# Patient Record
Sex: Female | Born: 1970
Health system: Southern US, Community
[De-identification: ages and names within clinical notes are randomized; demographics above are authoritative.]

## PROBLEM LIST (undated history)

## (undated) DIAGNOSIS — I10 Essential (primary) hypertension: Secondary | ICD-10-CM

## (undated) DIAGNOSIS — D649 Anemia, unspecified: Secondary | ICD-10-CM

## (undated) DIAGNOSIS — F419 Anxiety disorder, unspecified: Secondary | ICD-10-CM

## (undated) DIAGNOSIS — E785 Hyperlipidemia, unspecified: Secondary | ICD-10-CM

## (undated) DIAGNOSIS — R0600 Dyspnea, unspecified: Secondary | ICD-10-CM

## (undated) HISTORY — DX: Hyperlipidemia, unspecified: E78.5

## (undated) HISTORY — DX: Anxiety disorder, unspecified: F41.9

## (undated) HISTORY — DX: Anemia, unspecified: D64.9

## (undated) HISTORY — PX: APPENDECTOMY: SHX54

---

## 1999-09-26 ENCOUNTER — Emergency Department (HOSPITAL_COMMUNITY): Admission: EM | Admit: 1999-09-26 | Discharge: 1999-09-26 | Payer: Self-pay | Admitting: Emergency Medicine

## 2007-01-30 ENCOUNTER — Emergency Department (HOSPITAL_COMMUNITY): Admission: EM | Admit: 2007-01-30 | Discharge: 2007-01-30 | Payer: Self-pay | Admitting: Emergency Medicine

## 2010-05-05 ENCOUNTER — Emergency Department (HOSPITAL_BASED_OUTPATIENT_CLINIC_OR_DEPARTMENT_OTHER)
Admission: EM | Admit: 2010-05-05 | Discharge: 2010-05-05 | Disposition: A | Discharge status: Home / Self Care | Payer: Self-pay | Attending: Emergency Medicine | Admitting: Emergency Medicine

## 2010-05-05 DIAGNOSIS — R109 Unspecified abdominal pain: Secondary | ICD-10-CM | POA: Insufficient documentation

## 2010-05-05 DIAGNOSIS — F3289 Other specified depressive episodes: Secondary | ICD-10-CM | POA: Insufficient documentation

## 2010-05-05 DIAGNOSIS — R51 Headache: Secondary | ICD-10-CM | POA: Insufficient documentation

## 2010-05-05 DIAGNOSIS — F329 Major depressive disorder, single episode, unspecified: Secondary | ICD-10-CM | POA: Insufficient documentation

## 2010-05-05 DIAGNOSIS — I10 Essential (primary) hypertension: Secondary | ICD-10-CM | POA: Insufficient documentation

## 2010-05-05 LAB — URINALYSIS, ROUTINE W REFLEX MICROSCOPIC
Ketones, ur: NEGATIVE mg/dL
Leukocytes, UA: NEGATIVE
Nitrite: NEGATIVE
Protein, ur: 30 mg/dL — AB
Urobilinogen, UA: 0.2 mg/dL (ref 0.0–1.0)
pH: 7 (ref 5.0–8.0)

## 2010-05-05 LAB — DIFFERENTIAL
Basophils Relative: 0 % (ref 0–1)
Eosinophils Absolute: 0.1 10*3/uL (ref 0.0–0.7)
Monocytes Absolute: 0.4 10*3/uL (ref 0.1–1.0)
Monocytes Relative: 5 % (ref 3–12)

## 2010-05-05 LAB — CBC
Hemoglobin: 12.1 g/dL (ref 12.0–15.0)
MCH: 28.1 pg (ref 26.0–34.0)
MCHC: 35 g/dL (ref 30.0–36.0)
Platelets: 390 10*3/uL (ref 150–400)

## 2010-05-05 LAB — COMPREHENSIVE METABOLIC PANEL
ALT: 16 U/L (ref 0–35)
BUN: 5 mg/dL — ABNORMAL LOW (ref 6–23)
CO2: 28 mEq/L (ref 19–32)
CO2: 28 mEq/L (ref 19–32)
Calcium: 9.4 mg/dL (ref 8.4–10.5)
Chloride: 108 mEq/L (ref 96–112)
Creatinine, Ser: 1 mg/dL (ref 0.4–1.2)
GFR calc non Af Amer: >60 mL/min (ref >60)
Glucose, Bld: 117 mg/dL — ABNORMAL HIGH (ref 70–99)
Potassium: 3.4 mEq/L — ABNORMAL LOW (ref 3.5–5.1)
Sodium: 146 mEq/L — ABNORMAL HIGH (ref 135–145)
Total Bilirubin: 0.7 mg/dL (ref 0.3–1.2)

## 2010-05-05 LAB — POCT TOXICOLOGY PANEL

## 2010-05-05 LAB — ETHANOL: Alcohol, Ethyl (B): <10 mg/dL (ref 0–10)

## 2010-05-05 LAB — PROTIME-INR: Prothrombin Time: 13.4 seconds (ref 11.6–15.2)

## 2011-11-12 ENCOUNTER — Emergency Department (HOSPITAL_BASED_OUTPATIENT_CLINIC_OR_DEPARTMENT_OTHER)
Admission: EM | Admit: 2011-11-12 | Discharge: 2011-11-12 | Disposition: A | Discharge status: Home / Self Care | Payer: Self-pay | Attending: Emergency Medicine | Admitting: Emergency Medicine

## 2011-11-12 ENCOUNTER — Emergency Department (HOSPITAL_BASED_OUTPATIENT_CLINIC_OR_DEPARTMENT_OTHER): Payer: Self-pay

## 2011-11-12 ENCOUNTER — Encounter (HOSPITAL_BASED_OUTPATIENT_CLINIC_OR_DEPARTMENT_OTHER): Payer: Self-pay | Admitting: *Deleted

## 2011-11-12 DIAGNOSIS — D649 Anemia, unspecified: Secondary | ICD-10-CM | POA: Insufficient documentation

## 2011-11-12 DIAGNOSIS — R079 Chest pain, unspecified: Secondary | ICD-10-CM | POA: Insufficient documentation

## 2011-11-12 DIAGNOSIS — I10 Essential (primary) hypertension: Secondary | ICD-10-CM | POA: Insufficient documentation

## 2011-11-12 HISTORY — DX: Essential (primary) hypertension: I10

## 2011-11-12 LAB — COMPREHENSIVE METABOLIC PANEL
ALT: 9 U/L (ref 0–35)
BUN: 9 mg/dL (ref 6–23)
CO2: 25 mEq/L (ref 19–32)
Calcium: 7.9 mg/dL — ABNORMAL LOW (ref 8.4–10.5)
GFR calc Af Amer: 70 mL/min — ABNORMAL LOW (ref >90)
GFR calc non Af Amer: 61 mL/min — ABNORMAL LOW (ref >90)
Glucose, Bld: 82 mg/dL (ref 70–99)
Sodium: 140 mEq/L (ref 135–145)

## 2011-11-12 LAB — URINALYSIS, ROUTINE W REFLEX MICROSCOPIC
Bilirubin Urine: NEGATIVE
Ketones, ur: NEGATIVE mg/dL
Nitrite: NEGATIVE
Specific Gravity, Urine: 1.014 (ref 1.005–1.030)
Urobilinogen, UA: 1 mg/dL (ref 0.0–1.0)

## 2011-11-12 LAB — CBC WITH DIFFERENTIAL/PLATELET
Basophils Relative: 0 % (ref 0–1)
Eosinophils Absolute: 0.2 10*3/uL (ref 0.0–0.7)
MCH: 30.5 pg (ref 26.0–34.0)
MCHC: 35.6 g/dL (ref 30.0–36.0)
Neutrophils Relative %: 58 % (ref 43–77)
Platelets: 238 10*3/uL (ref 150–400)
RBC: 3.05 MIL/uL — ABNORMAL LOW (ref 3.87–5.11)

## 2011-11-12 LAB — URINE MICROSCOPIC-ADD ON

## 2011-11-12 LAB — PREGNANCY, URINE: Preg Test, Ur: NEGATIVE

## 2011-11-12 MED ORDER — FERROUS SULFATE 325 (65 FE) MG PO TABS: 325.0000 mg | ORAL_TABLET | Freq: Every day | ORAL | Status: DC

## 2011-11-12 NOTE — Discharge Instructions (Signed)
Iron Deficiency Anemia There are many types of anemia. Iron deficiency anemia is the most common. Iron deficiency anemia is a decrease in the number of red blood cells caused by too little iron. Without enough iron, your body does not produce enough hemoglobin. Hemoglobin is a substance in red blood cells that carries oxygen to the body's tissues. Iron deficiency anemia may leave you tired and short of breath. CAUSES   Lack of iron in the diet.   This may be seen in infants and children, because there is little iron in milk.   This may be seen in adults who do not eat enough iron-rich foods.   This may be seen in pregnant or breastfeeding women who do not take iron supplements. There is a much higher need for iron intake at these times.   Poor absorption of iron, as seen with intestinal disorders.   Intestinal bleeding.   Heavy periods.  SYMPTOMS  Mild anemia may not be noticeable. Symptoms may include:  Fatigue.   Headache.   Pale skin.   Weakness.   Shortness of breath.   Dizziness.   Cold hands and feet.   Fast or irregular heartbeat.  DIAGNOSIS  Diagnosis requires a thorough evaluation and physical exam by your caregiver.  Blood tests are generally used to confirm iron deficiency anemia.   Additional tests may be done to find the underlying cause of your anemia. These may include:   Testing for blood in the stool (fecal occult blood test).   A procedure to see inside the colon and rectum (colonoscopy).   A procedure to see inside the esophagus and stomach (endoscopy).  TREATMENT   Correcting the cause of the iron deficiency is the first step.   Medicines, such as oral contraceptives, can make heavy menstrual flows lighter.   Antibiotics and other medicines can be used to treat peptic ulcers.   Surgery may be needed to remove a bleeding polyp, tumor, or fibroid.   Often, iron supplements (ferrous sulfate) are taken.   For the best iron absorption, take  these supplements with an empty stomach.   You may need to take the supplements with food if you cannot tolerate them on an empty stomach. Vitamin C improves the absorption of iron. Your caregiver may recommend taking your iron tablets with a glass of orange juice or vitamin C supplement.   Milk and antacids should not be taken at the same time as iron supplements. They may interfere with the absorption of iron.   Iron supplements can cause constipation. A stool softener is often recommended.   Pregnant and breastfeeding women will need to take extra iron, because their normal diet usually will not provide the required amount.   Patients who cannot tolerate iron by mouth can take it through a vein (intravenously) or by an injection into the muscle.  HOME CARE INSTRUCTIONS   Ask your dietitian for help with diet questions.   Take iron and vitamins as directed by your caregiver.   Eat a diet rich in iron. Eat liver, lean beef, whole-grain bread, eggs, dried fruit, and dark green leafy vegetables.  SEEK IMMEDIATE MEDICAL CARE IF:   You have a fainting episode. Do not drive yourself. Call your local emergency services (911 in [place] if no other help is available.   You have chest pain, nausea, or vomiting.   You develop severe or increased shortness of breath with activities.   You develop weakness or increased thirst.   You have   a rapid heartbeat.   You develop unexplained sweating or become lightheaded when getting up from a chair or bed.  MAKE SURE YOU:   Understand these instructions.   Will watch your condition.   Will get help right away if you are not doing well or get worse.  Document Released: 02/28/2000 Document Revised: 02/19/2011 Document Reviewed: 07/09/2009 [facility] Patient Information 2012 [place], [place].Anemia, Frequently Asked Questions WHAT ARE THE SYMPTOMS OF ANEMIA?  Headache.   Difficulty thinking.   Fatigue.   Shortness of breath.   Weakness.    Rapid heartbeat.  AT WHAT POINT ARE PEOPLE CONSIDERED ANEMIC?  This varies with gender and age.   Both hemoglobin (Hgb) and hematocrit values are used to define anemia. These lab values are obtained from a complete blood count (CBC) test. This is performed at a caregiver's office.   The normal range of hemoglobin values for adult men is 14.0 g/dL to 57.8 g/dL. For nonpregnant women, values are 12.3 g/dL to 46.9 g/dL.   The World Health Organization defines anemia as less than 12 g/dL for nonpregnant women and less than 13 g/dL for men.   For adult males, the average normal hematocrit is 46%, and the range is 40% to 52%.   For adult females, the average normal hematocrit is 41%, and the range is 35% to 47%.   Values that fall below the lower limits can be a sign of anemia and should have further checking (evaluation).  GROUPS OF PEOPLE WHO ARE AT RISK FOR DEVELOPING ANEMIA INCLUDE:   Infants who are breastfed or taking a formula that is not fortified with iron.   Children going through a rapid growth spurt. The iron available can not keep up with the needs for a red cell mass which must grow with the child.   Women in childbearing years. They need iron because of blood loss during menstruation.   Pregnant women. The growing fetus creates a high demand for iron.   People with ongoing gastrointestinal blood loss are at risk of developing iron deficiency.   Individuals with leukemia or cancer who must receive chemotherapy or radiation to treat their disease. The drugs or radiation used to treat these diseases often decreases the bone marrow's ability to make cells of all classes. This includes red blood cells, white blood cells, and platelets.   Individuals with chronic inflammatory conditions such as rheumatoid arthritis or chronic infections.   The elderly.  ARE SOME TYPES OF ANEMIA INHERITED?   Yes, some types of anemia are due to inherited or genetic defects.   Sickle cell  anemia. This occurs most often in people of African, African American, and Mediterranean descent.   Thalassemia (or Cooley's anemia). This type is found in people of Mediterranean and Southeast Asian descent. These types of anemia are common.   [place]. This is rare.  CAN CERTAIN MEDICATIONS CAUSE A PERSON TO BECOME ANEMIC?  Yes. For example, drugs to fight cancer (chemotherapeutic agents) often cause anemia. These drugs can slow the bone marrow's ability to make red blood cells. If there are not enough red blood cells, the body does not get enough oxygen. WHAT HEMATOCRIT LEVEL IS REQUIRED TO DONATE BLOOD?  The lower limit of an acceptable hematocrit for blood donors is 38%. If you have a low hematocrit value, you should schedule an appointment with your caregiver. ARE BLOOD TRANSFUSIONS COMMONLY USED TO CORRECT ANEMIA, AND ARE THEY DANGEROUS?  They are used to treat anemia as a last resort. Your  caregiver will find the cause of the anemia and correct it if possible. Most blood transfusions are given because of excessive bleeding at the time of surgery, with trauma, or because of bone marrow suppression in patients with cancer or leukemia on chemotherapy. Blood transfusions are safer than ever before. We also know that blood transfusions affect the immune system and may increase certain risks. There is also a concern for human error. In 1/16,000 transfusions, a patient receives a transfusion of blood that is not matched with his or her blood type.  WHAT IS IRON DEFICIENCY ANEMIA AND CAN I CORRECT IT BY CHANGING MY DIET?  Iron is an essential part of hemoglobin. Without enough hemoglobin, anemia develops and the body does not get the right amount of oxygen. Iron deficiency anemia develops after the body has had a low level of iron for a long time. This is either caused by blood loss, not taking in or absorbing enough iron, or increased demands for iron (like pregnancy or rapid growth).  Foods from animal  origin such as beef, chicken, and pork, are good sources of iron. Be sure to have one of these foods at each meal. Vitamin C helps your body absorb iron. Foods rich in Vitamin C include citrus, bell pepper, strawberries, spinach and cantaloupe. In some cases, iron supplements may be needed in order to correct the iron deficiency. In the case of poor absorption, extra iron may have to be given directly into the vein through a needle (intravenously). I HAVE BEEN DIAGNOSED WITH IRON DEFICIENCY ANEMIA AND MY CAREGIVER PRESCRIBED IRON SUPPLEMENTS. HOW LONG WILL IT TAKE FOR MY BLOOD TO BECOME NORMAL?  It depends on the degree of anemia at the beginning of treatment. Most people with mild to moderate iron deficiency, anemia will correct the anemia over a period of 2 to 3 months. But after the anemia is corrected, the iron stored by the body is still low. Caregivers often suggest an additional 6 months of oral iron therapy once the anemia has been reversed. This will help prevent the iron deficiency anemia from quickly happening again. Non-anemic adult males should take iron supplements only under the direction of a doctor, too much iron can cause liver damage.  MY HEMOGLOBIN IS 9 G/DL AND I AM SCHEDULED FOR SURGERY. SHOULD I POSTPONE THE SURGERY?  If you have Hgb of 9, you should discuss this with your caregiver right away. Many patients with similar hemoglobin levels have had surgery without problems. If minimal blood loss is expected for a minor procedure, no treatment may be necessary.  If a greater blood loss is expected for more extensive procedures, you should ask your caregiver about being treated with erythropoietin and iron. This is to accelerate the recovery of your hemoglobin to a normal level before surgery. An anemic patient who undergoes high-blood-loss surgery has a greater risk of surgical complications and need for a blood transfusion, which also carries some risk.  I HAVE BEEN TOLD THAT HEAVY  MENSTRUAL PERIODS CAUSE ANEMIA. IS THERE ANYTHING I CAN DO TO PREVENT THE ANEMIA?  Anemia that results from heavy periods is usually due to iron deficiency. You can try to meet the increased demands for iron caused by the heavy monthly blood loss by increasing the intake of iron-rich foods. Iron supplements may be required. Discuss your concerns with your caregiver. WHAT CAUSES ANEMIA DURING PREGNANCY?  Pregnancy places major demands on the body. The mother must meet the needs of both her body and her  growing baby. The body needs enough iron and folate to make the right amount of red blood cells. To prevent anemia while pregnant, the mother should stay in close contact with her caregiver.  Be sure to eat a diet that has foods rich in iron and folate like liver and dark green leafy vegetables. Folate plays an important role in the normal development of a baby's spinal cord. Folate can help prevent serious disorders like spina bifida. If your diet does not provide adequate nutrients, you may want to talk with your caregiver about nutritional supplements.  WHAT IS THE RELATIONSHIP BETWEEN FIBROID TUMORS AND ANEMIA IN WOMEN?  The relationship is usually caused by the increased menstrual blood loss caused by fibroids. Good iron intake may be required to prevent iron deficiency anemia from developing.  Document Released: 10/09/2003 Document Revised: 02/19/2011 Document Reviewed: 03/25/2010 [facility] Patient Information 2012 [place], [place].

## 2011-11-12 NOTE — ED Notes (Signed)
Pain in her chest 30 mins. Fingers are numb. Yesterday she felt like things were crawling on her and chest pain same as today.

## 2011-11-12 NOTE — ED Provider Notes (Addendum)
History     CSN: [id number]  Arrival date & time 11/12/11  1928   First MD Initiated Contact with Patient 11/12/11 1946      Chief Complaint  Patient presents with  . Chest Pain    (Consider location/radiation/quality/duration/timing/severity/associated sxs/prior treatment) Patient is a 41 y.o. female presenting with chest pain. The history is provided by the patient.  Chest Pain The chest pain began yesterday (2 episodes 1 yesterday and 1 today). Duration of episode(s) is 2 minutes. Chest pain occurs intermittently. The chest pain is unchanged. The pain is associated with stress and exertion. At its most intense, the pain is at 3/10. The pain is currently at 0/10. The severity of the pain is mild. Quality: aching and tightness that lasted 2 min and resolved. The pain does not radiate. Chest pain is worsened by exertion. Primary symptoms include shortness of breath. Pertinent negatives for primary symptoms include no abdominal pain, no nausea and no vomiting.  Associated symptoms include lower extremity edema. She tried nothing for the symptoms.  Her past medical history is significant for hypertension.  Pertinent negatives for past medical history include no diabetes and no MI.     Past Medical History  Diagnosis Date  . Hypertension     Past Surgical History  Procedure Date  . Appendectomy     No family history on file.  History  Substance Use Topics  . Smoking status: Never Smoker   . Smokeless tobacco: Not on file  . Alcohol Use: No    OB History    Grav Para Term Preterm Abortions TAB SAB Ect Mult Living                  Review of Systems  Respiratory: Positive for shortness of breath.   Cardiovascular: Positive for chest pain.  Gastrointestinal: Negative for nausea, vomiting and abdominal pain.  Neurological:       Neck pain and tingling in the fingers for months  All other systems reviewed and are negative.    Allergies  Review of patient's allergies  indicates no known allergies.  Home Medications   Current Outpatient Rx  Name Route Sig Dispense Refill  . FLUOXETINE HCL 40 MG PO CAPS Oral Take 40 mg by mouth daily.    Marland Kitchen LISINOPRIL-HYDROCHLOROTHIAZIDE 20-12.5 MG PO TABS Oral Take 1 tablet by mouth daily.      BP 141/84  Pulse 77  Temp 98.7 F (37.1 C) (Oral)  Resp 20  SpO2 100%  Physical Exam  Nursing note and vitals reviewed. Constitutional: She is oriented to person, place, and time. She appears well-developed and well-nourished. No distress.  HENT:  Head: Normocephalic and atraumatic.  Mouth/Throat: Oropharynx is clear and moist.  Eyes: Conjunctivae and EOM are normal. Pupils are equal, round, and reactive to light.  Neck: Normal range of motion. Neck supple.  Cardiovascular: Normal rate, regular rhythm and intact distal pulses.   No murmur heard. Pulmonary/Chest: Effort normal and breath sounds normal. No respiratory distress. She has no wheezes. She has no rales. She exhibits no tenderness.  Abdominal: Soft. She exhibits no distension. There is no tenderness. There is no rebound and no guarding.  Musculoskeletal: Normal range of motion. She exhibits edema. She exhibits no tenderness.       1+ lower ext edema  Neurological: She is alert and oriented to person, place, and time. She has normal strength. No sensory deficit.  Skin: Skin is warm and dry. No rash noted. No erythema.  Psychiatric: She has a normal mood and affect. Her behavior is normal.    ED Course  Procedures (including critical care time)  Labs Reviewed  COMPREHENSIVE METABOLIC PANEL - Abnormal; Notable for the following:    Potassium 3.4 (*)     Calcium 7.9 (*)     Total Protein 5.8 (*)     Albumin 2.9 (*)     Total Bilirubin 0.1 (*)     GFR calc non Af Amer 61 (*)     GFR calc Af Amer 70 (*)     All other components within normal limits  URINALYSIS, ROUTINE W REFLEX MICROSCOPIC - Abnormal; Notable for the following:    APPearance CLOUDY (*)       Hgb urine dipstick LARGE (*)     All other components within normal limits  CBC WITH DIFFERENTIAL - Abnormal; Notable for the following:    RBC 3.05 (*)     Hemoglobin 9.3 (*)     HCT 26.1 (*)     All other components within normal limits  URINE MICROSCOPIC-ADD ON - Abnormal; Notable for the following:    Squamous Epithelial / LPF FEW (*)     All other components within normal limits  PREGNANCY, URINE  TROPONIN I   Dg Chest 2 View  11/12/2011  *RADIOLOGY REPORT*  Clinical Data: Chest pain.  Finger numbness.  CHEST - 2 VIEW  Comparison: None.  Findings: Lungs are clear.  Heart size upper normal.  No pneumothorax or pleural fluid.  There is abnormal increased density of the left first rib which appears mildly expanded.  IMPRESSION:  1.  No acute disease. 2.  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   Original Report Authenticated By: Bernadene Bell. Maricela Curet, M.D.      Date: 11/12/2011  Rate: 74  Rhythm: normal sinus rhythm  QRS Axis: normal  Intervals: normal  ST/T Wave abnormalities: normal  Conduction Disutrbances: none  Narrative Interpretation: unremarkable      1. Anemia       MDM   Patient with atypical chest pain that started yesterday. She states she's had 2 episodes that lasted no more than 2 minutes. She also stated that she felt short of breath with walking today and noticed swelling in her legs. Patient states she's been under more stress recently and that has caused pain similar to this in the past. Patient does take blood pressure medication and has been taking it as prescribed. She states she's currently on her period and does admit to heavy flow. Only risk factor for cardiac disease and hypertension. She is a TIMI 1 for more than one episode in 24 hours.  Patient denies any current pain. EKG is within normal limits. Urine pregnancy test is negative. CBC shows a new anemia with a  hemoglobin of 9.3 from 12.1 over a year ago. She has no prior history of anemia. This could be the cause of her symptoms.  9:48 PM Pt given iron.  Low suspicion for cardiac dx.  Will f/u with PCP.     Gwyneth Sprout, MD 11/12/11 4098  Gwyneth Sprout, MD 11/12/11 2149

## 2011-11-12 NOTE — ED Notes (Signed)
Pt reports chest pain mid sternum lasting only a few minutes, pressure like pain, intermittent sob, 2+ pedal edema,

## 2013-06-14 ENCOUNTER — Ambulatory Visit: Payer: Self-pay | Admitting: Family Medicine

## 2013-06-14 VITALS — BP 174/102 | HR 80 | Temp 97.9°F | Resp 18 | Ht 66.75 in | Wt 224.0 lb

## 2013-06-14 DIAGNOSIS — I10 Essential (primary) hypertension: Secondary | ICD-10-CM

## 2013-06-14 DIAGNOSIS — F411 Generalized anxiety disorder: Secondary | ICD-10-CM

## 2013-06-14 DIAGNOSIS — F329 Major depressive disorder, single episode, unspecified: Secondary | ICD-10-CM

## 2013-06-14 DIAGNOSIS — F3289 Other specified depressive episodes: Secondary | ICD-10-CM

## 2013-06-14 MED ORDER — FLUOXETINE HCL 40 MG PO CAPS: 40.0000 mg | ORAL_CAPSULE | Freq: Every day | ORAL | Status: DC

## 2013-06-14 MED ORDER — LISINOPRIL-HYDROCHLOROTHIAZIDE 20-12.5 MG PO TABS: 1.0000 | ORAL_TABLET | Freq: Every day | ORAL | Status: DC

## 2013-06-14 NOTE — Patient Instructions (Signed)

## 2013-06-14 NOTE — Progress Notes (Signed)
This chart was scribed for [name], MD by [name], Scribe. This patient was seen in room 2 and the patient's care was started at 7:40 PM.  Subjective:    Patient ID: Brittany Morgan, female    DOB: Dec 02, 1970, 43 y.o.   MRN: [id number]  HPI  HPI Comments: SHAKIYA Morgan is a 43 y.o. Female with a history of HTN (diagnosed 2 years ago) who presents to [facility] complaining of uncontrolled HTN. She states that she has been prescribed Lisinopril in the past but that she has been out of this medication for "a while", and she would like to start taking it again. Pt's husband states that pt's blood pressure was well-controlled when she was taking Lisinopril ("117-120's/75-80's). She reports that she has been having pressure in the back of her neck and intermittent headaches recently. She also states that when her blood pressure is elevated, her vision becomes "less sharp". She states that she noticed some leg swelling yesterday, but that she was on her feet for long periods yesterday. She reports that it is not unusual for her to have leg swelling, but that she has not really noticed much of this since before being diagnosed with HTN. She denies any history of stroke, MI or other cardiovascular history, or any other pertinent past medical history. She states that both of her parents are still living. She reports that her mother had an MI at age 80, which required stent placement. She states that her father has a history of HTN and that he had a CVA at about 24. She also states that her father has a questionable history of colon CA.  She has a secondary complaint of work-related stress recently. She works in a Forensic psychologist at [employer] and describes that she is required to do an unrealistic amount of work. She further reports that she feels like she is expendable. She states that she has a history of anxiety and has had issues with this in the past. She states that she has taken Prozac  in the past with some relief of this, but that she is out of this medication at well. She denies any SI or HI. She denies any SOB.  She states that she is married and she has 3 children (age 38, 29 and 72). She reports that she still has a menstrual cycle, and that it has never been regular, and that she occasionally goes 2-3 months without having a period. She reports that her husband had a vasectomy.   Past Medical History  Diagnosis Date  . Hypertension   . Anxiety      Past Surgical History  Procedure Laterality Date  . Appendectomy     No Known Allergies  Current outpatient prescriptions: FLUoxetine (PROZAC) 40 MG capsule, Take 1 capsule (40 mg total) by mouth daily., Disp: 90 capsule, Rfl: 1;  lisinopril-hydrochlorothiazide (PRINZIDE,ZESTORETIC) 20-12.5 MG per tablet, Take 1 tablet by mouth daily., Disp: 90 tablet, Rfl: 1;  ferrous sulfate 325 (65 FE) MG tablet, Take 1 tablet (325 mg total) by mouth daily., Disp: 30 tablet, Rfl: 0   Review of Systems  Constitutional: Negative for fever, chills, diaphoresis and fatigue.  Eyes: Positive for visual disturbance.  Respiratory: Negative for cough and shortness of breath.   Cardiovascular: Positive for leg swelling. Negative for chest pain and palpitations.  Gastrointestinal: Negative for nausea, vomiting, abdominal pain, diarrhea and constipation.  Endocrine: Negative for cold intolerance, heat intolerance, polydipsia, polyphagia and  polyuria.  Musculoskeletal: Positive for neck pain and neck stiffness.  Neurological: Positive for headaches. Negative for dizziness, tremors, seizures, syncope, facial asymmetry, speech difficulty, weakness, light-headedness and numbness.  Psychiatric/Behavioral: Negative for suicidal ideas, sleep disturbance, self-injury and dysphoric mood. The patient is nervous/anxious.       Objective:   Physical Exam  Constitutional: She is oriented to person, place, and time. She appears well-developed and  well-nourished. No distress.  HENT:  Head: Normocephalic and atraumatic.  Right Ear: External ear normal.  Left Ear: External ear normal.  Nose: Nose normal.  Mouth/Throat: Oropharynx is clear and moist.  Eyes: Conjunctivae and EOM are normal. Pupils are equal, round, and reactive to light.  Neck: Normal range of motion and full passive range of motion without pain. Neck supple. No spinous process tenderness and no muscular tenderness present. Carotid bruit is not present. Normal range of motion present. No thyromegaly present.  Cardiovascular: Normal rate, regular rhythm, normal heart sounds and intact distal pulses.  Exam reveals no gallop and no friction rub.   No murmur heard. Pulmonary/Chest: Effort normal and breath sounds normal. She has no wheezes. She has no rales.  Abdominal: Soft. Bowel sounds are normal. She exhibits no distension and no mass. There is no tenderness. There is no rebound and no guarding.  Musculoskeletal: Normal range of motion.  Lymphadenopathy:    She has no cervical adenopathy.  Neurological: She is alert and oriented to person, place, and time. No cranial nerve deficit.  Skin: Skin is warm and dry. No rash noted. She is not diaphoretic. No erythema. No pallor.  Psychiatric: She has a normal mood and affect. Her behavior is normal.  Nursing note and vitals reviewed.  Filed Vitals:   06/14/13 1810  BP: 174/102  Pulse: 80  Temp: 97.9 F (36.6 C)  TempSrc: Oral  Resp: 18  Height: 5' 6.75" (1.695 m)  Weight: 224 lb (101.606 kg)  SpO2: 98%        Assessment & Plan:   1. Essential hypertension, benign   2. Generalized anxiety disorder     1.  HTN: uncontrolled due to non-compliance with medications; obtain labs; rx provided; recommend RTC two months for BP check on medication. 2.  Generalized anxiety disorder: New/recurrent; rx for Prozac 40mg  1/2 tablet daily for two months and then increase to one tablet daily as needed.  RTC two months for  reevaluation.   Meds ordered this encounter  Medications  . FLUoxetine (PROZAC) 40 MG capsule    Sig: Take 1 capsule (40 mg total) by mouth daily.    Dispense:  90 capsule    Refill:  1  . lisinopril-hydrochlorothiazide (PRINZIDE,ZESTORETIC) 20-12.5 MG per tablet    Sig: Take 1 tablet by mouth daily.    Dispense:  90 tablet    Refill:  1   I personally performed the services described in this documentation, which was scribed in my presence.  The recorded information has been reviewed and is accurate.  Reginia Forts, M.D.  [facility] [address] [place], [place]  [address] [phone] phone [phone] fax

## 2013-06-15 LAB — COMPREHENSIVE METABOLIC PANEL
ALBUMIN: 4.4 g/dL (ref 3.5–5.2)
ALK PHOS: 51 U/L (ref 39–117)
ALT: 12 U/L (ref 0–35)
AST: 14 U/L (ref 0–37)
BUN: 19 mg/dL (ref 6–23)
CALCIUM: 9.7 mg/dL (ref 8.4–10.5)
CHLORIDE: 103 meq/L (ref 96–112)
CO2: 26 mEq/L (ref 19–32)
Creat: 1.19 mg/dL — ABNORMAL HIGH (ref 0.50–1.10)
Glucose, Bld: 91 mg/dL (ref 70–99)
POTASSIUM: 4.7 meq/L (ref 3.5–5.3)
SODIUM: 138 meq/L (ref 135–145)
TOTAL PROTEIN: 7.6 g/dL (ref 6.0–8.3)
Total Bilirubin: 0.3 mg/dL (ref 0.2–1.2)

## 2013-06-21 ENCOUNTER — Encounter: Payer: Self-pay | Admitting: Family Medicine

## 2014-02-11 ENCOUNTER — Other Ambulatory Visit: Payer: Self-pay | Admitting: Family Medicine

## 2014-04-20 ENCOUNTER — Other Ambulatory Visit: Payer: Self-pay

## 2014-04-20 MED ORDER — FLUOXETINE HCL 40 MG PO CAPS: ORAL_CAPSULE | ORAL | Status: DC

## 2014-04-20 MED ORDER — LISINOPRIL-HYDROCHLOROTHIAZIDE 20-12.5 MG PO TABS: ORAL_TABLET | ORAL | Status: DC

## 2014-05-18 ENCOUNTER — Other Ambulatory Visit: Payer: Self-pay | Admitting: Family Medicine

## 2014-05-30 ENCOUNTER — Telehealth: Payer: Self-pay

## 2014-05-30 NOTE — Telephone Encounter (Signed)
Got faxes from pharm to RF prozac and lisinopril. We have been putting messages on last few RFs to RTC. I faxed pharm back w/this info and also LMOM for pt to CB w/f/up plan or just RTC right away.

## 2014-05-31 ENCOUNTER — Telehealth: Payer: Self-pay

## 2014-05-31 NOTE — Telephone Encounter (Signed)
Patient understands that she must have an office visit in order to get more medication refills and is wondering is she cold atleast could get a week supply of blood pressure until she can be seen. Patient can't make it for an office visit until a week from now and she is completely out. Please call patient if there are questions!

## 2014-06-01 MED ORDER — LISINOPRIL-HYDROCHLOROTHIAZIDE 20-12.5 MG PO TABS: ORAL_TABLET | ORAL | Status: DC

## 2014-06-01 MED ORDER — FLUOXETINE HCL 40 MG PO CAPS: ORAL_CAPSULE | ORAL | Status: DC

## 2014-06-01 NOTE — Telephone Encounter (Signed)
See notes under 3/17 phone message

## 2014-06-13 ENCOUNTER — Ambulatory Visit (INDEPENDENT_AMBULATORY_CARE_PROVIDER_SITE_OTHER): Payer: No Typology Code available for payment source | Admitting: Emergency Medicine

## 2014-06-13 VITALS — BP 161/116 | HR 76 | Temp 98.0°F | Resp 16 | Ht 67.0 in | Wt 235.0 lb

## 2014-06-13 DIAGNOSIS — I10 Essential (primary) hypertension: Secondary | ICD-10-CM | POA: Diagnosis not present

## 2014-06-13 DIAGNOSIS — F411 Generalized anxiety disorder: Secondary | ICD-10-CM | POA: Diagnosis not present

## 2014-06-13 MED ORDER — LISINOPRIL-HYDROCHLOROTHIAZIDE 20-12.5 MG PO TABS: ORAL_TABLET | ORAL | Status: DC

## 2014-06-13 MED ORDER — FLUOXETINE HCL 40 MG PO CAPS: ORAL_CAPSULE | ORAL | Status: DC

## 2014-06-13 NOTE — Addendum Note (Signed)
Addended byGrant Fontana R on: 06/13/2014 06:58 PM   Modules accepted: Orders

## 2014-06-13 NOTE — Progress Notes (Signed)
[facility] [address] [place] [address] [phone]  Date:  06/13/2014   Name:  Brittany Morgan   DOB:  [birth year]   MRN:  [id number]  PCP:  No PCP Per Patient    Chief Complaint: Medication Refill   History of Present Illness:  Brittany Morgan is a 44 y.o. very pleasant female patient who presents with the following:  Hypertension.  Sporadically takes her medications due finances Takes BP at home and runs in 114-120 range  Just got insurance.  Tolerating medications well with no adverse effect or end organ issues No improvement with over the counter medications or other home remedies. Denies other complaint or health concern today.   There are no active problems to display for this patient.   Past Medical History  Diagnosis Date  . Hypertension   . Anxiety     Past Surgical History  Procedure Laterality Date  . Appendectomy      History  Substance Use Topics  . Smoking status: Never Smoker   . Smokeless tobacco: Never Used  . Alcohol Use: No    Family History  Problem Relation Age of Onset  . Hyperlipidemia Mother   . Heart disease Mother     AMI; s/p stenting  . Stroke Father 42  . Hypertension Father     No Known Allergies  Medication list has been reviewed and updated.  Current Outpatient Prescriptions on File Prior to Visit  Medication Sig Dispense Refill  . FLUoxetine (PROZAC) 40 MG capsule TAKE ONE CAPSULE BY MOUTH ONCE DAILY. NO MORE REFILLS WITHOUT OFFICE VISIT - 2ND NOTICE 7 capsule 0  . lisinopril-hydrochlorothiazide (PRINZIDE,ZESTORETIC) 20-12.5 MG per tablet TAKE ONE TABLET BY MOUTH ONCE DAILY. NO MORE REFILLS WITHOUT OFFICE VISIT - 2ND NOTICE 7 tablet 0  . ferrous sulfate 325 (65 FE) MG tablet Take 1 tablet (325 mg total) by mouth daily. (Patient not taking: Reported on 06/13/2014) 30 tablet 0   No current facility-administered medications on file prior to visit.    Review of Systems:  As per HPI, otherwise  negative.    Physical Examination: Filed Vitals:   06/13/14 1820  BP: 161/116  Pulse: 76  Temp: 98 F (36.7 C)  Resp: 16   Filed Vitals:   06/13/14 1820  Height: 5\' 7"  (1.702 m)  Weight: 235 lb (106.595 kg)   Body mass index is 36.8 kg/(m^2). Ideal Body Weight: Weight in (lb) to have BMI = 25: 159.3  GEN: WDWN, NAD, Non-toxic, A & O x 3 HEENT: Atraumatic, Normocephalic. Neck supple. No masses, No LAD. Ears and Nose: No external deformity. CV: RRR, No M/G/R. No JVD. No thrill. No extra heart sounds. PULM: CTA B, no wheezes, crackles, rhonchi. No retractions. No resp. distress. No accessory muscle use. ABD: S, NT, ND, +BS. No rebound. No HSM. EXTR: No c/c/e NEURO Normal gait.  PSYCH: Normally interactive. Conversant. Not depressed or anxious appearing.  Calm demeanor.    Assessment and Plan: Hypertension Depression Labs refill Reinforced need for follow up  Signed,  Ellison Carwin, MD

## 2014-06-13 NOTE — Patient Instructions (Signed)

## 2016-02-05 ENCOUNTER — Ambulatory Visit (HOSPITAL_COMMUNITY)
Admission: EM | Admit: 2016-02-05 | Discharge: 2016-02-05 | Disposition: A | Discharge status: Home / Self Care | Payer: Self-pay | Attending: Emergency Medicine | Admitting: Emergency Medicine

## 2016-02-05 ENCOUNTER — Encounter (HOSPITAL_COMMUNITY): Payer: Self-pay | Admitting: Emergency Medicine

## 2016-02-05 DIAGNOSIS — Z760 Encounter for issue of repeat prescription: Secondary | ICD-10-CM

## 2016-02-05 DIAGNOSIS — F419 Anxiety disorder, unspecified: Secondary | ICD-10-CM

## 2016-02-05 DIAGNOSIS — I10 Essential (primary) hypertension: Secondary | ICD-10-CM

## 2016-02-05 LAB — POCT I-STAT, CHEM 8
BUN: 14 mg/dL (ref 6–20)
Calcium, Ion: 1.2 mmol/L (ref 1.15–1.40)
Chloride: 101 mmol/L (ref 101–111)
Creatinine, Ser: 1.1 mg/dL — ABNORMAL HIGH (ref 0.44–1.00)
GLUCOSE: 92 mg/dL (ref 65–99)
HCT: 42 % (ref 36.0–46.0)
Hemoglobin: 14.3 g/dL (ref 12.0–15.0)
Potassium: 3.8 mmol/L (ref 3.5–5.1)
Sodium: 139 mmol/L (ref 135–145)
TCO2: 28 mmol/L (ref 0–100)

## 2016-02-05 MED ORDER — FLUOXETINE HCL 40 MG PO CAPS: ORAL_CAPSULE | ORAL | 2 refills | Status: DC

## 2016-02-05 MED ORDER — LISINOPRIL-HYDROCHLOROTHIAZIDE 20-12.5 MG PO TABS: ORAL_TABLET | ORAL | 2 refills | Status: DC

## 2016-02-05 NOTE — ED Provider Notes (Signed)
[place]    CSN: [id number] Arrival date & time: 02/05/16  1338     History   Chief Complaint Chief Complaint  Patient presents with  . Medication Refill    HPI CLEATIS HOFFERT is a 45 y.o. female.   HPI She is a 45 year old woman here for medication refill. She has been out of her medication for about 1-1/2 months. She reports a history of anxiety and depression as well as hypertension. She has been on Prozac which she states controls her anxiety well. She denies any suicidal ideation or homicidal thoughts.  She states her blood pressure was well controlled on her lisinopril-HCTZ combo. She denies any chest pain, shortness of breath, vision changes, leg swelling. She does report a slight headache, but attributes this to how she slept last night.  The only reason she does not have her medications is that she has been unable to afford a doctor. She and her husband are hoping to get insurance here shortly.  Past Medical History:  Diagnosis Date  . Anxiety   . Hypertension     There are no active problems to display for this patient.   Past Surgical History:  Procedure Laterality Date  . APPENDECTOMY      OB History    No data available       Home Medications    Prior to Admission medications   Medication Sig Start Date End Date Taking? Authorizing Provider  FLUoxetine (PROZAC) 40 MG capsule TAKE ONE CAPSULE BY MOUTH ONCE DAILY. 02/05/16   Melony Overly, MD  lisinopril-hydrochlorothiazide (PRINZIDE,ZESTORETIC) 20-12.5 MG tablet TAKE ONE TABLET BY MOUTH ONCE DAILY. 02/05/16   Melony Overly, MD    Family History Family History  Problem Relation Age of Onset  . Hyperlipidemia Mother   . Heart disease Mother     AMI; s/p stenting  . Stroke Father 27  . Hypertension Father     Social History Social History  Substance Use Topics  . Smoking status: Never Smoker  . Smokeless tobacco: Never Used  . Alcohol use No     Allergies   Patient has no  known allergies.   Review of Systems Review of Systems As in history of present illness  Physical Exam Triage Vital Signs ED Triage Vitals  Enc Vitals Group     BP 02/05/16 1358 (!) 159/101     Pulse Rate 02/05/16 1358 71     Resp 02/05/16 1358 18     Temp 02/05/16 1358 98.1 F (36.7 C)     Temp Source 02/05/16 1358 Oral     SpO2 02/05/16 1358 99 %     Weight --      Height --      Head Circumference --      Peak Flow --      Pain Score 02/05/16 1403 0     Pain Loc --      Pain Edu? --      Excl. in [place]? --    No data found.   Updated Vital Signs BP (!) 159/101 (BP Location: Left Arm)   Pulse 71   Temp 98.1 F (36.7 C) (Oral)   Resp 18   LMP 12/29/2015 (Approximate)   SpO2 99%   Visual Acuity Right Eye Distance:   Left Eye Distance:   Bilateral Distance:    Right Eye Near:   Left Eye Near:    Bilateral Near:     Physical Exam  Constitutional:  She is oriented to person, place, and time. She appears well-developed and well-nourished. No distress.  Cardiovascular: Normal rate, regular rhythm and normal heart sounds.   No murmur heard. Pulmonary/Chest: Effort normal and breath sounds normal. No respiratory distress. She has no wheezes. She has no rales.  Neurological: She is alert and oriented to person, place, and time.     UC Treatments / Results  Labs (all labs ordered are listed, but only abnormal results are displayed) Labs Reviewed  POCT I-STAT, CHEM 8 - Abnormal; Notable for the following:       Result Value   Creatinine, Ser 1.10 (*)    All other components within normal limits    EKG  EKG Interpretation None       Radiology No results found.  Procedures Procedures (including critical care time)  Medications Ordered in UC Medications - No data to display   Initial Impression / Assessment and Plan / UC Course  I have reviewed the triage vital signs and the nursing notes.  Pertinent labs & imaging results that were available  during my care of the patient were reviewed by me and considered in my medical decision making (see chart for details).  Clinical Course     Creatinine mildly elevated at 1.1, but stable from prior blood work. Three-month supply of Prozac and lisinopril-HCTZ provided. She will continue workking on obtaining insurance and finding a PCP.  Final Clinical Impressions(s) / UC Diagnoses   Final diagnoses:  Medication refill  Anxiety  Essential hypertension    New Prescriptions Current Discharge Medication List       Melony Overly, MD 02/05/16 1448

## 2016-02-05 NOTE — Discharge Instructions (Signed)
Your blood work looks good. I provided a three-month supply a Prozac and hypertension medicine. Follow-up as needed.

## 2016-02-05 NOTE — ED Triage Notes (Signed)
The patient presented to the [facility] with a complaint of needing her medications refilled. The patient requested a refill on her HTN meds and Prozac. The patient is followed by [facility].

## 2016-06-15 DIAGNOSIS — I10 Essential (primary) hypertension: Secondary | ICD-10-CM | POA: Insufficient documentation

## 2016-06-15 DIAGNOSIS — F32A Depression, unspecified: Secondary | ICD-10-CM | POA: Insufficient documentation

## 2016-06-24 DIAGNOSIS — E785 Hyperlipidemia, unspecified: Secondary | ICD-10-CM | POA: Insufficient documentation

## 2017-05-20 ENCOUNTER — Other Ambulatory Visit: Payer: Self-pay

## 2017-05-20 ENCOUNTER — Ambulatory Visit (HOSPITAL_COMMUNITY)
Admission: EM | Admit: 2017-05-20 | Discharge: 2017-05-20 | Disposition: A | Discharge status: Home / Self Care | Payer: Self-pay | Attending: Family Medicine | Admitting: Family Medicine

## 2017-05-20 ENCOUNTER — Encounter (HOSPITAL_COMMUNITY): Payer: Self-pay | Admitting: Emergency Medicine

## 2017-05-20 DIAGNOSIS — I10 Essential (primary) hypertension: Secondary | ICD-10-CM

## 2017-05-20 DIAGNOSIS — Z760 Encounter for issue of repeat prescription: Secondary | ICD-10-CM

## 2017-05-20 DIAGNOSIS — F419 Anxiety disorder, unspecified: Secondary | ICD-10-CM

## 2017-05-20 MED ORDER — LISINOPRIL-HYDROCHLOROTHIAZIDE 20-12.5 MG PO TABS: ORAL_TABLET | ORAL | 2 refills | Status: DC

## 2017-05-20 MED ORDER — FLUOXETINE HCL 40 MG PO CAPS: ORAL_CAPSULE | ORAL | 2 refills | Status: DC

## 2017-05-20 NOTE — ED Triage Notes (Signed)
Pt states she is inbetween PCPs due to insurance, ran out of her anxiety and BP meds, has been out of her anxiety meds for several months, and last took her BP med 4 days ago.

## 2017-05-20 NOTE — ED Provider Notes (Signed)
[place]   [id number] 05/20/17 Arrival Time: 7169  ASSESSMENT & PLAN:  1. Medication refill   2. Essential hypertension   3. Anxiety     Meds ordered this encounter  Medications  . FLUoxetine (PROZAC) 40 MG capsule    Sig: TAKE ONE CAPSULE BY MOUTH ONCE DAILY.    Dispense:  30 capsule    Refill:  2  . lisinopril-hydrochlorothiazide (PRINZIDE,ZESTORETIC) 20-12.5 MG tablet    Sig: TAKE ONE TABLET BY MOUTH ONCE DAILY.    Dispense:  30 tablet    Refill:  2   May f/u here until she finds a PCP. Reviewed expectations re: course of current medical issues. Questions answered. Outlined signs and symptoms indicating need for more acute intervention. Patient verbalized understanding. After Visit Summary given.   SUBJECTIVE: History from: patient. Brittany Morgan is a 47 y.o. female who presents requesting medication refill. Looking for PCP. Plans to have insurance again within a few months. Out of BP and anxiety medications. No current adverse symptoms. Reports medical problems are controlled with these medications. Last took BP medication about 4 days ago.  Current medical problems include:  Past Medical History:  Diagnosis Date  . Anxiety   . Hypertension    Current Outpatient Medications:  .  FLUoxetine (PROZAC) 40 MG capsule, TAKE ONE CAPSULE BY MOUTH ONCE DAILY., Disp: 30 capsule, Rfl: 2 .  lisinopril-hydrochlorothiazide (PRINZIDE,ZESTORETIC) 20-12.5 MG tablet, TAKE ONE TABLET BY MOUTH ONCE DAILY., Disp: 30 tablet, Rfl: 2  ROS: As per HPI.   OBJECTIVE:  Vitals:   05/20/17 1704  BP: (!) 152/103  Pulse: 86  Resp: 18  Temp: 98.1 F (36.7 C)  SpO2: 100%    General appearance: alert; no distress Neck: supple  Lungs: clear to auscultation bilaterally Heart: regular rate and rhythm Abdomen: soft, non-tender Extremities: no edema; symmetrical with no gross deformities Skin: warm and dry Neurologic: normal gait; normal symmetric reflexes Psychological:  alert and cooperative; normal mood and affect  No Known Allergies  Past Medical History:  Diagnosis Date  . Anxiety   . Hypertension    Social History   Socioeconomic History  . Marital status: Married    Spouse name: Not on file  . Number of children: Not on file  . Years of education: Not on file  . Highest education level: Not on file  Social Needs  . Financial resource strain: Not on file  . Food insecurity - worry: Not on file  . Food insecurity - inability: Not on file  . Transportation needs - medical: Not on file  . Transportation needs - non-medical: Not on file  Occupational History  . Not on file  Tobacco Use  . Smoking status: Never Smoker  . Smokeless tobacco: Never Used  Substance and Sexual Activity  . Alcohol use: No    Alcohol/week: 0.0 oz  . Drug use: No  . Sexual activity: Yes    Birth control/protection: Surgical    Comment: Husband vasectomy  Other Topics Concern  . Not on file  Social History Narrative   Marital status: married      Children: 3 children (24, 86, 69); no grandchildren.      Employment:  Environmental consultant.      Tobacco:      Alcohol:      Exercise:         Family History  Problem Relation Age of Onset  . Hyperlipidemia Mother   . Heart disease Mother  AMI; s/p stenting  . Stroke Father 67  . Hypertension Father    Past Surgical History:  Procedure Laterality Date  . Felicita Gage, MD 05/24/17 1009

## 2019-10-27 ENCOUNTER — Encounter (HOSPITAL_COMMUNITY): Payer: Self-pay | Admitting: Emergency Medicine

## 2019-10-27 ENCOUNTER — Other Ambulatory Visit: Payer: Self-pay

## 2019-10-27 ENCOUNTER — Ambulatory Visit (HOSPITAL_COMMUNITY)
Admission: EM | Admit: 2019-10-27 | Discharge: 2019-10-27 | Disposition: A | Discharge status: Home / Self Care | Payer: Self-pay | Attending: Urgent Care | Admitting: Urgent Care

## 2019-10-27 DIAGNOSIS — F32A Depression, unspecified: Secondary | ICD-10-CM

## 2019-10-27 DIAGNOSIS — F329 Major depressive disorder, single episode, unspecified: Secondary | ICD-10-CM

## 2019-10-27 DIAGNOSIS — I10 Essential (primary) hypertension: Secondary | ICD-10-CM

## 2019-10-27 DIAGNOSIS — R519 Headache, unspecified: Secondary | ICD-10-CM

## 2019-10-27 MED ORDER — LISINOPRIL 10 MG PO TABS
10.0000 mg | ORAL_TABLET | Freq: Every day | ORAL | 0 refills | Status: DC
Start: 2019-10-27 — End: 2019-12-22

## 2019-10-27 MED ORDER — HYDROCHLOROTHIAZIDE 12.5 MG PO TABS: 12.5000 mg | ORAL_TABLET | Freq: Every day | ORAL | 0 refills | Status: DC

## 2019-10-27 MED ORDER — FLUOXETINE HCL 10 MG PO TABS
10.0000 mg | ORAL_TABLET | Freq: Every day | ORAL | 1 refills | Status: DC
Start: 2019-10-27 — End: 2020-02-21

## 2019-10-27 NOTE — ED Provider Notes (Signed)
[place]   MRN: [id number] DOB: Nov 18, 1970  Subjective:   Brittany Morgan is a 49 y.o. female presenting for help with her blood pressure, depression.  Patient states that she was previously on lisinopril hydrochlorothiazide but lost her PCP and did not follow-up.  She has been out of her medication.  Her blood pressures have been increasingly worsening 140s to 160s.  She has also had more persistent headaches.  Denies confusion, vision change, chest pain, shortness of breath.  She has also previously been on Prozac and came off of this for the same as above.  States that she has had worsening difficulty with her mood, feeling down less energy.  She has also had difficulty focusing.  Denies history of suicide attempt, desires for self-harm.  Denies any thoughts of death currently.  She would like to establish care with a new PCP but does not have insurance.  No current facility-administered medications for this encounter.  Current Outpatient Medications:  .  FLUoxetine (PROZAC) 40 MG capsule, TAKE ONE CAPSULE BY MOUTH ONCE DAILY. (Patient not taking: Reported on 10/27/2019), Disp: 30 capsule, Rfl: 2 .  lisinopril-hydrochlorothiazide (PRINZIDE,ZESTORETIC) 20-12.5 MG tablet, TAKE ONE TABLET BY MOUTH ONCE DAILY. (Patient not taking: Reported on 10/27/2019), Disp: 30 tablet, Rfl: 2   No Known Allergies  Past Medical History:  Diagnosis Date  . Anxiety   . Hypertension      Past Surgical History:  Procedure Laterality Date  . APPENDECTOMY      Family History  Problem Relation Age of Onset  . Hyperlipidemia Mother   . Heart disease Mother        AMI; s/p stenting  . Stroke Father 41  . Hypertension Father     Social History   Tobacco Use  . Smoking status: Never Smoker  . Smokeless tobacco: Never Used  Substance Use Topics  . Alcohol use: No    Alcohol/week: 0.0 standard drinks  . Drug use: No    ROS   Objective:   Vitals: BP (!) 157/117 (BP Location: Right  Arm)   Pulse 100   Temp 98.1 F (36.7 C) (Oral)   Resp 18   LMP  (LMP Unknown)   SpO2 100%   Physical Exam Constitutional:      General: She is not in acute distress.    Appearance: Normal appearance. She is well-developed. She is not ill-appearing, toxic-appearing or diaphoretic.  HENT:     Head: Normocephalic and atraumatic.     Nose: Nose normal.     Mouth/Throat:     Mouth: Mucous membranes are moist.  Eyes:     Extraocular Movements: Extraocular movements intact.     Pupils: Pupils are equal, round, and reactive to light.  Cardiovascular:     Rate and Rhythm: Normal rate and regular rhythm.     Pulses: Normal pulses.     Heart sounds: Normal heart sounds. No murmur heard.  No friction rub. No gallop.   Pulmonary:     Effort: Pulmonary effort is normal. No respiratory distress.     Breath sounds: Normal breath sounds. No stridor. No wheezing, rhonchi or rales.  Skin:    General: Skin is warm and dry.     Findings: No rash.  Neurological:     Mental Status: She is alert and oriented to person, place, and time.     Cranial Nerves: No cranial nerve deficit.     Motor: No weakness.     Coordination: Coordination normal.  Gait: Gait normal.     Deep Tendon Reflexes: Reflexes normal.     Comments: Negative Romberg and pronator drift.  Psychiatric:        Attention and Perception: Attention normal.        Mood and Affect: Mood is depressed. Mood is not anxious or elated. Affect is flat. Affect is not labile, blunt, angry, tearful or inappropriate.        Behavior: Behavior normal.        Thought Content: Thought content normal.        Cognition and Memory: Cognition normal.        Judgment: Judgment normal.      Assessment and Plan :   PDMP not reviewed this encounter.  1. Essential hypertension   2. Generalized headaches   3. Depression, unspecified depression type     Patient has no signs of ACS, stroke on exam.  Recommended restarting hydrochlorothiazide,  lisinopril and included dosing titration instructions.  We will also restart her on Prozac at 10 with titration instructions to 20 mg.  Emphasized need for follow-up, currently patient contracts for safety and has no thoughts about death or suicidal ideation.  Discussed black box warning for patient.  Establish care with new PCP through [facility] internal medicine.  Counseled patient on potential for adverse effects with medications prescribed/recommended today, ER and return-to-clinic precautions discussed, patient verbalized understanding.    Jaynee Eagles, [place] 10/27/19 1831

## 2019-10-27 NOTE — Discharge Instructions (Signed)
For your blood pressure medication start taking lisinopril and hydrochlorothiazide once daily for 1 week. Thereafter increase to 2 tablets once daily for each medication if your blood pressure readings remain greater than 163W systolic (top number).   For fluoxetine, start with 1 tablet once daily for 2 weeks. If you're doing ok with the medication then increase to 2 tablets once daily. I would highly recommend follow up if symptoms persist thereafter.    For diabetes or elevated blood sugar, please make sure you are avoiding starchy, carbohydrate foods like pasta, breads, pastry, rice, potatoes, desserts. These foods can elevated your blood sugar. Also, avoid sodas, sweet teas, sugary beverages, fruit juices.  Drinking plain water will be much more helpful, try 64 ounces of water daily.  It is okay to flavor your water naturally by cutting cucumber, lemon, mint or lime, placing it in a picture with water and drinking it over a period of 2 to 3 days as long as it remains refrigerated.    For elevated blood pressure, make sure you are monitoring salt in your diet.  Do not eat restaurant foods and limit processed foods at home, prepare/cook your own foods at home.  Processed foods include things like frozen meals preseasoned meats and dinners, deli meats, canned foods as they are high in sodium/salt.  Make sure your pain attention to sodium labels on foods you by at the grocery store.  For seasoning you can use a brand called Mrs. Dash which includes a lot of salt free seasonings.  Salads - kale, spinach, cabbage, spring mix; use seeds like pumpkin seeds or sunflower seeds, almonds, walnuts or pecans; you can also use 1-2 hard boiled eggs in your salads Fruits - avocadoes, berries (blueberries, raspberries, blackberries), apples, oranges, pomegranate, pear; avoid eating bananas, grapes regularly Vegetables - aspargus, cauliflower, broccoli, green beans, brussel spouts, bell peppers; stay away from starchy  vegetables like potatoes, carrots, peas  Regarding meat it is better to eat lean meats and limit your red meat including pork to once a week.  Wild caught fish, chicken breast are good options as they tend to be leaner sources of good protein.   DO NOT EAT ANY FOODS ON THIS LIST THAT YOU ARE ALLERGIC TO.

## 2019-10-27 NOTE — ED Triage Notes (Signed)
Pt presents with Hypertension. States has hx of HTN but has been off medication for 1 year. C/o of headache.   Denies blurred vision or dizziness. States does not have a PCP.   Pt states she has not been taking Prozac for 1 year but thinks she needs to start taking it again due to the amount of stress she is under.

## 2019-10-28 ENCOUNTER — Emergency Department (HOSPITAL_COMMUNITY)
Admission: EM | Admit: 2019-10-28 | Discharge: 2019-10-29 | Disposition: A | Discharge status: Home / Self Care | Payer: Self-pay | Attending: Emergency Medicine | Admitting: Emergency Medicine

## 2019-10-28 ENCOUNTER — Encounter (HOSPITAL_COMMUNITY): Payer: Self-pay | Admitting: Emergency Medicine

## 2019-10-28 ENCOUNTER — Emergency Department (HOSPITAL_COMMUNITY): Payer: Self-pay

## 2019-10-28 DIAGNOSIS — R059 Cough, unspecified: Secondary | ICD-10-CM

## 2019-10-28 DIAGNOSIS — Z7951 Long term (current) use of inhaled steroids: Secondary | ICD-10-CM | POA: Insufficient documentation

## 2019-10-28 DIAGNOSIS — I10 Essential (primary) hypertension: Secondary | ICD-10-CM | POA: Insufficient documentation

## 2019-10-28 DIAGNOSIS — R918 Other nonspecific abnormal finding of lung field: Secondary | ICD-10-CM | POA: Insufficient documentation

## 2019-10-28 DIAGNOSIS — J029 Acute pharyngitis, unspecified: Secondary | ICD-10-CM | POA: Insufficient documentation

## 2019-10-28 DIAGNOSIS — Z20822 Contact with and (suspected) exposure to covid-19: Secondary | ICD-10-CM | POA: Insufficient documentation

## 2019-10-28 DIAGNOSIS — R05 Cough: Secondary | ICD-10-CM | POA: Insufficient documentation

## 2019-10-28 DIAGNOSIS — Z79899 Other long term (current) drug therapy: Secondary | ICD-10-CM | POA: Insufficient documentation

## 2019-10-28 DIAGNOSIS — R509 Fever, unspecified: Secondary | ICD-10-CM | POA: Insufficient documentation

## 2019-10-28 DIAGNOSIS — E876 Hypokalemia: Secondary | ICD-10-CM | POA: Insufficient documentation

## 2019-10-28 LAB — COMPREHENSIVE METABOLIC PANEL
ALT: 13 U/L (ref 0–44)
AST: 17 U/L (ref 15–41)
Albumin: 4.4 g/dL (ref 3.5–5.0)
Alkaline Phosphatase: 60 U/L (ref 38–126)
Anion gap: 12 (ref 5–15)
BUN: 7 mg/dL (ref 6–20)
CO2: 25 mmol/L (ref 22–32)
Calcium: 9.8 mg/dL (ref 8.9–10.3)
Chloride: 101 mmol/L (ref 98–111)
Creatinine, Ser: 1.12 mg/dL — ABNORMAL HIGH (ref 0.44–1.00)
GFR calc Af Amer: >60 mL/min (ref >60)
GFR calc non Af Amer: 57 mL/min — ABNORMAL LOW (ref >60)
Glucose, Bld: 143 mg/dL — ABNORMAL HIGH (ref 70–99)
Potassium: 3 mmol/L — ABNORMAL LOW (ref 3.5–5.1)
Sodium: 138 mmol/L (ref 135–145)
Total Bilirubin: 0.9 mg/dL (ref 0.3–1.2)
Total Protein: 7.8 g/dL (ref 6.5–8.1)

## 2019-10-28 LAB — CBC WITH DIFFERENTIAL/PLATELET
Abs Immature Granulocytes: 0.23 10*3/uL — ABNORMAL HIGH (ref 0.00–0.07)
Basophils Absolute: 0.1 10*3/uL (ref 0.0–0.1)
Basophils Relative: 0 %
Eosinophils Absolute: 0 10*3/uL (ref 0.0–0.5)
Eosinophils Relative: 0 %
HCT: 38.1 % (ref 36.0–46.0)
Hemoglobin: 13.2 g/dL (ref 12.0–15.0)
Immature Granulocytes: 1 %
Lymphocytes Relative: 4 %
Lymphs Abs: 0.7 10*3/uL (ref 0.7–4.0)
MCH: 30 pg (ref 26.0–34.0)
MCHC: 34.6 g/dL (ref 30.0–36.0)
MCV: 86.6 fL (ref 80.0–100.0)
Monocytes Absolute: 1.2 10*3/uL — ABNORMAL HIGH (ref 0.1–1.0)
Monocytes Relative: 6 %
Neutro Abs: 18.2 10*3/uL — ABNORMAL HIGH (ref 1.7–7.7)
Neutrophils Relative %: 89 %
Platelets: 264 10*3/uL (ref 150–400)
RBC: 4.4 MIL/uL (ref 3.87–5.11)
RDW: 13.1 % (ref 11.5–15.5)
Smear Review: ADEQUATE
WBC: 20.4 10*3/uL — ABNORMAL HIGH (ref 4.0–10.5)
nRBC: 0 % (ref 0.0–0.2)

## 2019-10-28 LAB — URINALYSIS, ROUTINE W REFLEX MICROSCOPIC
Bacteria, UA: NONE SEEN
Bilirubin Urine: NEGATIVE
Glucose, UA: NEGATIVE mg/dL
Ketones, ur: NEGATIVE mg/dL
Leukocytes,Ua: NEGATIVE
Nitrite: NEGATIVE
Protein, ur: 100 mg/dL — AB
Specific Gravity, Urine: 1.011 (ref 1.005–1.030)
pH: 5 (ref 5.0–8.0)

## 2019-10-28 LAB — LACTIC ACID, PLASMA: Lactic Acid, Venous: 1.5 mmol/L (ref 0.5–1.9)

## 2019-10-28 LAB — POC SARS CORONAVIRUS 2 AG -  ED: SARS Coronavirus 2 Ag: NEGATIVE

## 2019-10-28 NOTE — ED Triage Notes (Signed)
Reports she has been vaccinated against COVID

## 2019-10-28 NOTE — ED Notes (Signed)
Pt was assisted in a comfortable laying position.

## 2019-10-28 NOTE — ED Triage Notes (Signed)
Patient presents with multiple complaints. Reports she has not taken her BP meds X1 year, states her BP has been elevated. Also reports cough X1 day. Reports N/V since this AM.

## 2019-10-29 ENCOUNTER — Emergency Department (HOSPITAL_COMMUNITY): Payer: Self-pay

## 2019-10-29 LAB — I-STAT BETA HCG BLOOD, ED (MC, WL, AP ONLY): I-stat hCG, quantitative: <5 m[IU]/mL (ref <5)

## 2019-10-29 LAB — MAGNESIUM: Magnesium: 1.8 mg/dL (ref 1.7–2.4)

## 2019-10-29 LAB — SARS CORONAVIRUS 2 BY RT PCR (HOSPITAL ORDER, PERFORMED IN ~~LOC~~ HOSPITAL LAB): SARS Coronavirus 2: NEGATIVE

## 2019-10-29 MED ORDER — POTASSIUM CHLORIDE ER 10 MEQ PO TBCR
10.0000 meq | EXTENDED_RELEASE_TABLET | Freq: Two times a day (BID) | ORAL | 0 refills | Status: DC
Start: 2019-10-29 — End: 2019-11-28

## 2019-10-29 MED ORDER — LORAZEPAM 2 MG/ML IJ SOLN
0.5000 mg | Freq: Once | INTRAMUSCULAR | Status: AC
  Administered 2019-10-29: 0.5 mg via INTRAVENOUS
  Filled 2019-10-29: qty 1

## 2019-10-29 MED ORDER — ONDANSETRON 4 MG PO TBDP
4.0000 mg | ORAL_TABLET | Freq: Three times a day (TID) | ORAL | 0 refills | Status: DC | PRN
Start: 2019-10-29 — End: 2019-12-22

## 2019-10-29 MED ORDER — DOXYCYCLINE HYCLATE 100 MG PO CAPS: 100.0000 mg | ORAL_CAPSULE | Freq: Two times a day (BID) | ORAL | 0 refills | Status: AC

## 2019-10-29 MED ORDER — ACETAMINOPHEN 325 MG PO TABS
650.0000 mg | ORAL_TABLET | Freq: Once | ORAL | Status: AC
  Administered 2019-10-29: 650 mg via ORAL
  Filled 2019-10-29: qty 2

## 2019-10-29 MED ORDER — IOHEXOL 350 MG/ML SOLN
61.0000 mL | Freq: Once | INTRAVENOUS | Status: AC | PRN
  Administered 2019-10-29: 61 mL via INTRAVENOUS

## 2019-10-29 MED ORDER — LACTATED RINGERS IV BOLUS
1000.0000 mL | Freq: Once | INTRAVENOUS | Status: AC
  Administered 2019-10-29: 1000 mL via INTRAVENOUS

## 2019-10-29 MED ORDER — ALBUTEROL SULFATE HFA 108 (90 BASE) MCG/ACT IN AERS: 1.0000 | INHALATION_SPRAY | Freq: Four times a day (QID) | RESPIRATORY_TRACT | 0 refills | Status: DC | PRN

## 2019-10-29 NOTE — ED Notes (Signed)
Patient verbalizes understanding of discharge instructions. Opportunity for questioning and answers were provided. Armband removed by staff, pt discharged from ED to home via POV  

## 2019-10-29 NOTE — Discharge Instructions (Addendum)
Your symptoms may be due to a viral illness, however, due to the negative Covid test and the evidence of multifocal pneumonia on the chest CT, we will initiate antibiotics as a precaution.  Otherwise, treatment is symptomatic care and it is important to note that these symptoms may last for 7-14 days.   Hand washing: Wash your hands throughout the day, but especially before and after touching the face, using the restroom, sneezing, coughing, or touching surfaces that have been coughed or sneezed upon. Hydration: Symptoms of most illnesses will be intensified and complicated by dehydration. Dehydration can also extend the duration of symptoms. Drink plenty of fluids and get plenty of rest. You should be drinking at least half a liter of water an hour to stay hydrated. Electrolyte drinks (ex. Gatorade, Powerade, Pedialyte) are also encouraged. You should be drinking enough fluids to make your urine light yellow, almost clear. If this is not the case, you are not drinking enough water. Please note that some of the treatments indicated below will not be effective if you are not adequately hydrated. Diet: Please concentrate on hydration, however, you may introduce food slowly.  Start with a clear liquid diet, progressed to a full liquid diet, and then bland solids as you are able. Pain or fever: Ibuprofen, Naproxen, or acetaminophen (generic for Tylenol) for pain or fever.  Antiinflammatory medications: Take 600 mg of ibuprofen every 6 hours or 440 mg (over the counter dose) to 500 mg (prescription dose) of naproxen every 12 hours for the next 3 days. After this time, these medications may be used as needed for pain. Take these medications with food to avoid upset stomach. Choose only one of these medications, do not take them together. Acetaminophen (generic for Tylenol): Should you continue to have additional pain while taking the ibuprofen or naproxen, you may add in acetaminophen as needed. Your daily total  maximum amount of acetaminophen from all sources should be limited to 4000mg /day for persons without liver problems, or 2000mg /day for those with liver problems. Nausea/vomiting: Use the ondansetron (generic for Zofran) for nausea or vomiting.  This medication may not prevent all vomiting or nausea, but can help facilitate better hydration. Things that can help with nausea/vomiting also include peppermint/menthol candies, vitamin B12, and ginger. Diarrhea: May use medications such as loperamide (Imodium) or Bismuth subsalicylate (Pepto-Bismol). Albuterol: May use the albuterol as needed for instances of shortness of breath. Zyrtec or Claritin: May add these medication daily to control underlying symptoms of congestion, sneezing, and other signs of allergies.  These medications are available over-the-counter. Generics: Cetirizine (generic for Zyrtec) and loratadine (generic for Claritin). Fluticasone: Use fluticasone (generic for Flonase), as directed, for nasal and sinus congestion.  This medication is available over-the-counter. Congestion: Plain guaifenesin (generic for plain Mucinex) may help relieve congestion. Saline sinus rinses and saline nasal sprays may also help relieve congestion.  Sore throat: Warm liquids or Chloraseptic spray may help soothe a sore throat. Gargle twice a day with a salt water solution made from a half teaspoon of salt in a cup of warm water.  Follow up: Follow up with a primary care provider within the next two weeks should symptoms fail to resolve. Return: Return to the ED for significantly worsening symptoms, shortness of breath, persistent vomiting, large amounts of blood in stool, or any other major concerns.  For prescription assistance, may try using prescription discount sites or apps, such as goodrx.com  Your blood pressure was higher than normal today.  Refill prescriptions  for both the hydrochlorothiazide (HCTZ) and the lisinopril were sent to the pharmacy on  August 13.  Please make sure to take these medications daily. Follow-up with a primary care provider on this matter.  Your potassium level was lower than normal today.  Please take the potassium supplementation and have this level rechecked by a primary care provider.  Lung mass was noted on both the chest x-ray and chest CT.  This will need to be followed up on by a pulmonologist (lung specialist).

## 2019-10-29 NOTE — ED Provider Notes (Signed)
[facility] EMERGENCY DEPARTMENT Provider Note   CSN: [id number] Arrival date & time: 10/28/19  1817     History Chief Complaint  Patient presents with  . Emesis  . Cough    Brittany Morgan is a 49 y.o. female.  HPI      Brittany Morgan is a 49 y.o. female, with a history of HTN, anxiety, presenting to the ED with nonproductive cough for the last 2 days.   Patient had a sore throat 3 days ago, but this has resolved.  She has been experiencing nausea and multiple episodes of nonbilious nonbloody emesis over the past 2 days.  Chills and subjective fever. She and her significant other at the bedside states that he actually came in originally because they were worried about her high blood pressure. Denies use of tobacco or illicit drugs.  Vaccinated against Covid with [employer] vaccine. She has only intermittently been taking her medications for her blood pressure. Denies shortness of breath, chest pain, abdominal pain, diarrhea, hematochezia/melena, lower extremity edema/pain, or any other complaints.  Past Medical History:  Diagnosis Date  . Anxiety   . Hypertension     There are no problems to display for this patient.   Past Surgical History:  Procedure Laterality Date  . APPENDECTOMY       OB History   No obstetric history on file.     Family History  Problem Relation Age of Onset  . Hyperlipidemia Mother   . Heart disease Mother        AMI; s/p stenting  . Stroke Father 58  . Hypertension Father     Social History   Tobacco Use  . Smoking status: Never Smoker  . Smokeless tobacco: Never Used  Substance Use Topics  . Alcohol use: No    Alcohol/week: 0.0 standard drinks  . Drug use: No    Home Medications Prior to Admission medications   Medication Sig Start Date End Date Taking? Authorizing Provider  albuterol (VENTOLIN HFA) 108 (90 Base) MCG/ACT inhaler Inhale 1-2 puffs into the lungs every 6 (six) hours as needed for wheezing or  shortness of breath. 10/29/19   Mieke Brinley C, PA-C  doxycycline (VIBRAMYCIN) 100 MG capsule Take 1 capsule (100 mg total) by mouth 2 (two) times daily for 7 days. 10/29/19 11/05/19  Shanesha Bednarz C, PA-C  FLUoxetine (PROZAC) 10 MG tablet Take 1 tablet (10 mg total) by mouth daily. 10/27/19   Jaynee Eagles, PA-C  hydrochlorothiazide (HYDRODIURIL) 12.5 MG tablet Take 1 tablet (12.5 mg total) by mouth daily. 10/27/19   Jaynee Eagles, PA-C  lisinopril (ZESTRIL) 10 MG tablet Take 1 tablet (10 mg total) by mouth daily. 10/27/19   Jaynee Eagles, PA-C  ondansetron (ZOFRAN ODT) 4 MG disintegrating tablet Take 1 tablet (4 mg total) by mouth every 8 (eight) hours as needed for nausea or vomiting. 10/29/19   Tavaris Eudy C, PA-C  potassium chloride (KLOR-CON) 10 MEQ tablet Take 1 tablet (10 mEq total) by mouth 2 (two) times daily for 5 days. 10/29/19 11/03/19  Dontee Jaso C, PA-C  lisinopril-hydrochlorothiazide (PRINZIDE,ZESTORETIC) 20-12.5 MG tablet TAKE ONE TABLET BY MOUTH ONCE DAILY. Patient not taking: Reported on 10/27/2019 05/20/17 10/27/19  Vanessa Kick, MD    Allergies    Patient has no known allergies.  Review of Systems   Review of Systems  Constitutional: Positive for chills and fever. Negative for diaphoresis.  HENT: Positive for sore throat (resolved). Negative for facial swelling, trouble swallowing and  voice change.   Respiratory: Positive for cough. Negative for shortness of breath.   Cardiovascular: Negative for chest pain and leg swelling.  Gastrointestinal: Positive for nausea and vomiting. Negative for abdominal pain, blood in stool and diarrhea.  Musculoskeletal: Negative for back pain.  Neurological: Negative for dizziness, syncope and weakness.  All other systems reviewed and are negative.   Physical Exam Updated Vital Signs BP (!) 163/108 (BP Location: Right Arm)   Pulse (!) 103   Temp 99.3 F (37.4 C) (Oral)   Resp 14   Ht 5\' 6"  (1.676 m)   Wt 103 kg   LMP  (LMP Unknown)   SpO2 97%   BMI  36.64 kg/m   Physical Exam Vitals and nursing note reviewed.  Constitutional:      General: She is not in acute distress.    Appearance: She is well-developed. She is not diaphoretic.  HENT:     Head: Normocephalic and atraumatic.     Mouth/Throat:     Mouth: Mucous membranes are moist.     Pharynx: Oropharynx is clear.  Eyes:     Conjunctiva/sclera: Conjunctivae normal.  Cardiovascular:     Rate and Rhythm: Normal rate and regular rhythm.     Pulses: Normal pulses.          Radial pulses are 2+ on the right side and 2+ on the left side.       Posterior tibial pulses are 2+ on the right side and 2+ on the left side.     Heart sounds: Normal heart sounds.     Comments: Tactile temperature in the extremities appropriate and equal bilaterally. Pulmonary:     Effort: Pulmonary effort is normal. No respiratory distress.     Breath sounds: Normal breath sounds.     Comments: No increased work of breathing.  Speaks in full sentences without noted difficulty. Abdominal:     Palpations: Abdomen is soft.     Tenderness: There is no abdominal tenderness. There is no guarding.  Musculoskeletal:     Cervical back: Neck supple.     Right lower leg: No edema.     Left lower leg: No edema.  Lymphadenopathy:     Cervical: No cervical adenopathy.  Skin:    General: Skin is warm and dry.  Neurological:     Mental Status: She is alert.  Psychiatric:        Mood and Affect: Mood and affect normal.        Speech: Speech normal.        Behavior: Behavior normal.     ED Results / Procedures / Treatments   Labs (all labs ordered are listed, but only abnormal results are displayed) Labs Reviewed  COMPREHENSIVE METABOLIC PANEL - Abnormal; Notable for the following components:      Result Value   Potassium 3.0 (*)    Glucose, Bld 143 (*)    Creatinine, Ser 1.12 (*)    GFR calc non Af Amer 57 (*)    All other components within normal limits  CBC WITH DIFFERENTIAL/PLATELET - Abnormal;  Notable for the following components:   WBC 20.4 (*)    Neutro Abs 18.2 (*)    Monocytes Absolute 1.2 (*)    Abs Immature Granulocytes 0.23 (*)    All other components within normal limits  URINALYSIS, ROUTINE W REFLEX MICROSCOPIC - Abnormal; Notable for the following components:   Hgb urine dipstick SMALL (*)    Protein, ur 100 (*)  All other components within normal limits  SARS CORONAVIRUS 2 BY RT PCR (HOSPITAL ORDER, [facility])  LACTIC ACID, PLASMA  MAGNESIUM  POC SARS CORONAVIRUS 2 AG -  ED  I-STAT BETA HCG BLOOD, ED ([facility], [facility], [facility] ONLY)    EKG EKG Interpretation  Date/Time:  Saturday October 28 2019 21:32:29 EDT Ventricular Rate:  104 PR Interval:  148 QRS Duration: 86 QT Interval:  422 QTC Calculation: 554 R Axis:   -26 Text Interpretation: Sinus tachycardia Biatrial enlargement new ST & T wave abnormality, consider lateral ischemia Prolonged QT Confirmed by Blanchie Dessert [phone]) on 10/29/2019 10:00:16 AM    EKG Interpretation  Date/Time:  Sunday October 29 2019 13:09:19 EDT Ventricular Rate:  93 PR Interval:  160 QRS Duration: 102 QT Interval:  368 QTC Calculation: 457 R Axis:   -4 Text Interpretation: Normal sinus rhythm Nonspecific T wave abnormality Prolonged QT RESOLVED SINCE PREVIOUS Confirmed by Blanchie Dessert [phone]) on 10/29/2019 1:31:46 PM        Radiology No results found.  CT Angio Chest PE W and/or Wo Contrast  Result Date: 10/29/2019 CLINICAL DATA:  Shortness of breath and cough. EXAM: CT ANGIOGRAPHY CHEST WITH CONTRAST TECHNIQUE: Multidetector CT imaging of the chest was performed using the standard protocol during bolus administration of intravenous contrast. Multiplanar CT image reconstructions and MIPs were obtained to evaluate the vascular anatomy. CONTRAST:  89mL OMNIPAQUE IOHEXOL 350 MG/ML SOLN COMPARISON:  Chest radiograph 10/28/2019 FINDINGS: Cardiovascular: Heart is mildly enlarged. Dilated main pulmonary  artery measuring up to 3 cm. Ascending thoracic aorta measures 3.4 cm. Trace fluid superior pericardial recess. Adequate opacification of the pulmonary arterial system. No evidence for acute pulmonary embolus. Mediastinum/Nodes: No enlarged axillary, mediastinal or hilar lymphadenopathy. Prominent subcentimeter superior mediastinal lymph nodes are demonstrated including a prevascular node measuring 7 mm (image 43; series 5). Lungs/Pleura: Central airways are patent. Multifocal bilateral ground-glass nodules are demonstrated with a more focal nodule within the medial right lower lobe (image 83; series 6) and left lung base (image 118; series 6). Reference right upper lobe nodule measures 0.8 cm (image 55; series 6). Within the left upper hemithorax there is a 7.9 x 8.9 x 8.7 cm cystic mass (image 33; series 5). Possible soft tissue versus artifact along the superior margin of the mass (image 81; series 8). Mass appears to abut the superior aspect of the mediastinum. The inferior aspect of the mass courses posterior to the descending thoracic aorta (image 58; series 5). The cranial most portion of the mass extends within the supraclavicular region adjacent to the vertebral artery (image 7; series 5). Upper Abdomen: No acute process. Musculoskeletal: No aggressive or acute appearing osseous lesions. There is a 1.8 cm mass within the right breast. There is a 1.6 cm mass within the right breast (image 67; series 5). There is a 1.7 cm mass with central calcifications right breast (image 73; series 5). Review of the MIP images confirms the above findings. IMPRESSION: 1. No evidence for acute pulmonary embolus. 2. Multifocal bilateral ground-glass nodules which may represent infection/inflammation. Findings raise the possibility of COVID-19 infection. Continued follow-up until resolution is recommended. 3. There is a 7.9 cm cystic mass within the left upper hemithorax. The mass may potentially be originating from the  posterior aspect of the mediastinum. The cranial most aspect of the mass extends into the supraclavicular region and abuts the left vertebral artery. Possible soft tissue versus artifact along the superior margin of the mass. Potential considerations  include lymphangioma or cystic mediastinal mass. Recommend pulmonary consultation for further evaluation. Consider nonurgent MRI for further characterization as clinically indicated after resolution of the acute symptomatology. 4. Prominent subcentimeter superior mediastinal lymph nodes which may be reactive in etiology. Recommend attention on follow-up. 5. Dilated main pulmonary artery as can be seen with pulmonary arterial hypertension. 6. Multiple right breast masses. Patient needs dedicated mammography and breast ultrasound. 7. These results were called by telephone at the time of interpretation on 10/29/2019 at 11:51 am to provider [facility] , who verbally acknowledged these results. Electronically Signed   By: Lovey Newcomer M.D.   On: 10/29/2019 11:53   DG Chest Portable 1 View  Result Date: 10/28/2019 CLINICAL DATA:  Cough EXAM: PORTABLE CHEST 1 VIEW COMPARISON:  11/12/2011 FINDINGS: The right lung is grossly clear. Large apical, possibly pleural mass on the left measuring at least 9 point 7 cm. Borderline cardiomegaly. Retrocardiac opacity, questionable for hiatal hernia. No pleural effusion or pneumothorax. IMPRESSION: 1. Large left apical, possibly pleural mass measuring at least 9 cm. CT chest recommended for further evaluation. 2. Retrocardiac opacity, questionable for hiatal hernia. This could also be assessed at CT. Electronically Signed   By: Donavan Foil M.D.   On: 10/28/2019 20:19    Procedures Procedures (including critical care time)  Medications Ordered in ED Medications  lactated ringers bolus 1,000 mL (0 mLs Intravenous Stopped 10/29/19 1338)  LORazepam (ATIVAN) injection 0.5 mg (0.5 mg Intravenous Given 10/29/19 1053)  iohexol  (OMNIPAQUE) 350 MG/ML injection 61 mL (61 mLs Intravenous Contrast Given 10/29/19 1102)  acetaminophen (TYLENOL) tablet 650 mg (650 mg Oral Given 10/29/19 1334)    ED Course  I have reviewed the triage vital signs and the nursing notes.  Pertinent labs & imaging results that were available during my care of the patient were reviewed by me and considered in my medical decision making (see chart for details).  Clinical Course as of Oct 31 1318  Sun Oct 29, 2019  0950 Denies family history of cancer.  DG Chest Portable 1 View [[facility]]    Clinical Course User Index [[facility]] Brenee Gajda, Helane Gunther, PA-C   MDM Rules/Calculators/A&P                          Patient presents with cough, nausea, vomiting.  Borderline febrile here in the ED with mild tachycardia.  Nontoxic-appearing, maintains adequate SPO2 on room air, no apparent distress.  I personally reviewed and interpreted the patient's labs and imaging studies. Mild hypokalemia noted and addressed at discharge.  She will have this retested by her PCP. Leukocytosis noted. CT shows multiple abnormalities.  I suspect the multifocal pneumonia is more the cause of the patient's current symptoms.  Covid test negative.  Antibiotic therapy initiated. Additionally, there is a cystic-appearing mass in the left upper lung area.  I discussed this with the radiologist on the phone.  Recommendation is for nonemergent assessment by pulmonology.  This referral was made for the patient.   There are also breast masses noted on the CT.  Patient will need further imaging on this as well. She states she can get this done through a primary care provider.  All abnormalities were discussed with the patient and her significant other at bedside.  Patient was able to tolerate PO fluids prior to discharge.  The patient was given instructions for home care as well as return precautions. Patient voices understanding of these instructions, accepts the plan,  and is comfortable with  discharge.  Findings and plan of care discussed with Blanchie Dessert, MD.   Vitals:   10/29/19 0014 10/29/19 0504 10/29/19 0717 10/29/19 1337  BP: (!) 149/115 (!) 151/98 (!) 163/108 (!) 173/116  Pulse: 95 (!) 108 (!) 103 99  Resp: 18 18 14 16   Temp: 99.2 F (37.3 C) 100.2 F (37.9 C) 99.3 F (37.4 C) (!) 97.2 F (36.2 C)  TempSrc: Oral Oral Oral Oral  SpO2: 97% 96% 97% 97%  Weight:      Height:         Final Clinical Impression(s) / ED Diagnoses Final diagnoses:  Cough  Mass of left lung  Hypokalemia    Rx / DC Orders ED Discharge Orders         Ordered    Ambulatory referral to Pulmonology     Discontinue  Reprint     10/29/19 1324    ondansetron (ZOFRAN ODT) 4 MG disintegrating tablet  Every 8 hours PRN     Discontinue  Reprint     10/29/19 1325    doxycycline (VIBRAMYCIN) 100 MG capsule  2 times daily     Discontinue  Reprint     10/29/19 1325    potassium chloride (KLOR-CON) 10 MEQ tablet  2 times daily     Discontinue  Reprint     10/29/19 1327    albuterol (VENTOLIN HFA) 108 (90 Base) MCG/ACT inhaler  Every 6 hours PRN     Discontinue  Reprint     10/29/19 [place], Helane Gunther, PA-C 10/31/19 1326    Blanchie Dessert, MD 11/02/19 2255

## 2019-10-29 NOTE — ED Notes (Signed)
Pt provided cup of ice water and graham crackers for Fluid/PO challenge.

## 2019-10-31 ENCOUNTER — Other Ambulatory Visit: Payer: Self-pay

## 2019-10-31 ENCOUNTER — Telehealth (HOSPITAL_COMMUNITY): Payer: Self-pay | Admitting: Emergency Medicine

## 2019-10-31 ENCOUNTER — Encounter: Payer: Self-pay | Admitting: Pulmonary Disease

## 2019-10-31 ENCOUNTER — Ambulatory Visit (INDEPENDENT_AMBULATORY_CARE_PROVIDER_SITE_OTHER): Payer: Self-pay | Admitting: Pulmonary Disease

## 2019-10-31 ENCOUNTER — Telehealth: Payer: Self-pay | Admitting: Pulmonary Disease

## 2019-10-31 VITALS — BP 138/94 | HR 100 | Temp 97.5°F | Ht 66.0 in | Wt 218.4 lb

## 2019-10-31 DIAGNOSIS — R918 Other nonspecific abnormal finding of lung field: Secondary | ICD-10-CM

## 2019-10-31 NOTE — Telephone Encounter (Cosign Needed)
10/31/19 3:23 PM  In review of this patient's chart, I realized I had spoken with the patient and her husband about the breast abnormalities noted on the CT during her ED visit on August 14, but I had not put in for referral to the breast center for further imaging of the breast.  Patient's husband answered the phone.  He states patient seems to be doing a bit better and has certainly not been worsening.  Her blood pressure is much more under control, her fever has lessened, and her pulse rate has been steady around 100.  They have already followed up with the pulmonologist regarding her lung mass.  He states they were seen by Freda Jackson, MD, [facility].  He reviewed the CT scan results with them.  There is a plan for a PET scan and possibly biopsy.   He apparently also addressed the finding of breast masses on the CT and has proposed a plan for further imaging.   10/31/19 3:44 PM after speaking with the patient's husband, I called [facility] to confirm that they will be working up the breast masses as well.  They tell me they had to leave a message for Dr. Erin Fulling as he is likely with a patient at the moment.   10/31/19 2:30 PM I also called and left a message for Social Worker at [employer] as it seems patient does not currently have a PCP. I was trying to get in touch with social work to help patient set up with a PCP.

## 2019-10-31 NOTE — Telephone Encounter (Signed)
Dr. Erin Fulling please advise when you have spoken to NP.  Thanks!

## 2019-10-31 NOTE — Telephone Encounter (Deleted)
10/31/19 3:44 PM after speaking with the patient's husband, I called [facility] to confirm that they will be working up the breast masses as well.  They tell me they had to leave a message for Dr. Erin Fulling as he is likely with a patient at the moment.   10/31/19 2:30 PM I also called and left a message for Social Worker at [employer] as it seems patient does not currently have a PCP. I was trying to get in touch with social work to help patient set up with a PCP.

## 2019-10-31 NOTE — ED Provider Notes (Signed)
4:26 PM Spoke with Dr. Erin Fulling, [facility].  He confirms that he will take care of any further evaluation or management of not only the lung mass, but also the breast masses as well.   Lorayne Bender, PA-C 10/31/19 1654    Blanchie Dessert, MD 11/02/19 2255

## 2019-10-31 NOTE — Patient Instructions (Signed)
Will schedule you for a PET CT scan We will follow up with you about scheduling a procedure to biopsy the lung mass Please call with any questions in the meantime

## 2019-10-31 NOTE — Telephone Encounter (Signed)
Hi Ashely, yes I have spoken with the NP. We are all set. Thanks.

## 2019-11-03 NOTE — Progress Notes (Signed)
Patient ID: Brittany Morgan, female    DOB: [birth year], 49 y.o.   MRN: [id number]  Chief Complaint  Patient presents with  . Consult    pt seen in ED 8/14 for lung mass.  CTA performed 8/15.     Referring provider: Lorayne Bender, PA-C  HPI: Brittany Morgan is a 49 year old woman with history of hypertension who is referred to pulmonary clinic after an ED visit for cough who was found to have a lung mass.   The patient reports she has been in her normal state of health until recently where she developed a cough and sore throat. She also reported subjective fever and was experiencing chills. She tested negative for covid. She has completed her covid vaccines. She denies any weight loss, night sweats or lack of appetite over recent months. She is accompanied by her husband, Brittany Morgan, who also reports she has not had any changes in her health over recent months.   In the ED, a chest radiograph 10/28/19 reviewed which shows a 9cm mass of the left upper lung field, concerning for a pleural mass. CTA chest was then performed which shows a a 7.9 x 8.9 x 8.7cm cystic mass in the superior aspect of the mediastinum. There is also scattered ground glass nodules of the right upper, middle and lower lobes along with the left lower lobe. Also notable are 1.8cm mass, 1.6cm mass, and 1.7cm mass with central calcifications of the right breast. She does report a history of fibrous breast lumps which have been biopsied in the past.   There is family history of neuroblastoma in their middle child at age of 41 years old and then breast cancer diagnosis at age 73 years old. 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 She is a non-smoker.   No Known Allergies  Immunization History  Administered Date(s) Administered  . PFIZER SARS-COV-2 Vaccination 05/31/2019, 06/27/2019    Past Medical History:  Diagnosis Date  . Anxiety   . Hypertension     Tobacco  History: Social History   Tobacco Use  Smoking Status Passive Smoke Exposure - Never Smoker  Smokeless Tobacco Never Used  Tobacco Comment   mother smokes- is frequently around mother.    Counseling given: Not Answered Comment: mother smokes- is frequently around mother.    Outpatient Medications Prior to Visit  Medication Sig Dispense Refill  . albuterol (VENTOLIN HFA) 108 (90 Base) MCG/ACT inhaler Inhale 1-2 puffs into the lungs every 6 (six) hours as needed for wheezing or shortness of breath. 18 g 0  . doxycycline (VIBRAMYCIN) 100 MG capsule Take 1 capsule (100 mg total) by mouth 2 (two) times daily for 7 days. 14 capsule 0  . FLUoxetine (PROZAC) 10 MG tablet Take 1 tablet (10 mg total) by mouth daily. 90 tablet 1  . hydrochlorothiazide (HYDRODIURIL) 12.5 MG tablet Take 1 tablet (12.5 mg total) by mouth daily. 90 tablet 0  . lisinopril (ZESTRIL) 10 MG tablet Take 1 tablet (10 mg total) by mouth daily. 90 tablet 0  . ondansetron (ZOFRAN ODT) 4 MG disintegrating tablet Take 1 tablet (4 mg total) by mouth every 8 (eight) hours as needed for nausea or vomiting. 20 tablet 0  . potassium chloride (KLOR-CON) 10 MEQ tablet Take 1 tablet (10 mEq total) by mouth 2 (two) times daily for 5 days. 10 tablet 0   No facility-administered medications prior to visit.     Review of  Systems:   Constitutional:   No  weight loss, night sweats,  Fevers, chills, fatigue, or  lassitude.  HEENT:   No headaches,  Difficulty swallowing,  Tooth/dental problems, or  Sore throat,                No sneezing, itching, ear ache, nasal congestion, post nasal drip,   CV:  No chest pain,  Orthopnea, PND, swelling in lower extremities, anasarca, dizziness, palpitations, syncope.   GI  No heartburn, indigestion, abdominal pain, nausea, vomiting, diarrhea, change in bowel habits, loss of appetite, bloody stools.   Resp: some shortness of breath with exertion.  No excess mucus, no productive cough,  No  non-productive cough,  No coughing up of blood.  No change in color of mucus.  No wheezing.  No chest wall deformity  Skin: no rash or lesions.  GU: no dysuria, change in color of urine, no urgency or frequency.  No flank pain, no hematuria   MS:  No joint pain or swelling.  No decreased range of motion.  No back pain.   Physical Exam  BP (!) 138/94 (BP Location: Left Arm, Cuff Size: Normal)   Pulse 100   Temp (!) 97.5 F (36.4 C) (Temporal)   Ht 5\' 6"  (1.676 m)   Wt 218 lb 6.4 oz (99.1 kg)   LMP  (LMP Unknown)   SpO2 96%   BMI 35.25 kg/m   GEN: A/Ox3; pleasant , NAD, well nourished    HEENT:  [place]/AT,  EACs-clear, TMs-wnl, NOSE-clear, THROAT-clear, no lesions, no postnasal drip or exudate noted.   NECK:  Supple w/ fair ROM; no JVD; normal carotid impulses w/o bruits; no thyromegaly or nodules palpated; no lymphadenopathy.    RESP  Clear  P & A; w/o, wheezes/ rales/ or rhonchi. no accessory muscle use, no dullness to percussion  CARD:  RRR, no m/r/g, no peripheral edema, pulses intact, no cyanosis or clubbing.  GI:   Soft & nt; nml bowel sounds; no organomegaly or masses detected.   Musco: Warm bil, no deformities or joint swelling noted.   Neuro: alert, no focal deficits noted.    Skin: Warm, no lesions or rashes    Lab Results:  CBC    Component Value Date/Time   WBC 20.4 (H) 10/28/2019 2032   RBC 4.40 10/28/2019 2032   HGB 13.2 10/28/2019 2032   HCT 38.1 10/28/2019 2032   PLT 264 10/28/2019 2032   MCV 86.6 10/28/2019 2032   MCH 30.0 10/28/2019 2032   MCHC 34.6 10/28/2019 2032   RDW 13.1 10/28/2019 2032   LYMPHSABS 0.7 10/28/2019 2032   MONOABS 1.2 (H) 10/28/2019 2032   EOSABS 0.0 10/28/2019 2032   BASOSABS 0.1 10/28/2019 2032    BMET    Component Value Date/Time   NA 138 10/28/2019 2032   K 3.0 (L) 10/28/2019 2032   CL 101 10/28/2019 2032   CO2 25 10/28/2019 2032   GLUCOSE 143 (H) 10/28/2019 2032   BUN 7 10/28/2019 2032   CREATININE 1.12 (H)  10/28/2019 2032   CREATININE 1.19 (H) 06/14/2013 2003   CALCIUM 9.8 10/28/2019 2032   GFRNONAA 57 (L) 10/28/2019 2032   GFRAA >60 10/28/2019 2032    BNP No results found for: BNP  ProBNP No results found for: PROBNP  Imaging: CT Angio Chest PE W and/or Wo Contrast  Result Date: 10/29/2019 CLINICAL DATA:  Shortness of breath and cough. EXAM: CT ANGIOGRAPHY CHEST WITH CONTRAST TECHNIQUE: Multidetector CT imaging of the  chest was performed using the standard protocol during bolus administration of intravenous contrast. Multiplanar CT image reconstructions and MIPs were obtained to evaluate the vascular anatomy. CONTRAST:  24mL OMNIPAQUE IOHEXOL 350 MG/ML SOLN COMPARISON:  Chest radiograph 10/28/2019 FINDINGS: Cardiovascular: Heart is mildly enlarged. Dilated main pulmonary artery measuring up to 3 cm. Ascending thoracic aorta measures 3.4 cm. Trace fluid superior pericardial recess. Adequate opacification of the pulmonary arterial system. No evidence for acute pulmonary embolus. Mediastinum/Nodes: No enlarged axillary, mediastinal or hilar lymphadenopathy. Prominent subcentimeter superior mediastinal lymph nodes are demonstrated including a prevascular node measuring 7 mm (image 43; series 5). Lungs/Pleura: Central airways are patent. Multifocal bilateral ground-glass nodules are demonstrated with a more focal nodule within the medial right lower lobe (image 83; series 6) and left lung base (image 118; series 6). Reference right upper lobe nodule measures 0.8 cm (image 55; series 6). Within the left upper hemithorax there is a 7.9 x 8.9 x 8.7 cm cystic mass (image 33; series 5). Possible soft tissue versus artifact along the superior margin of the mass (image 81; series 8). Mass appears to abut the superior aspect of the mediastinum. The inferior aspect of the mass courses posterior to the descending thoracic aorta (image 58; series 5). The cranial most portion of the mass extends within the  supraclavicular region adjacent to the vertebral artery (image 7; series 5). Upper Abdomen: No acute process. Musculoskeletal: No aggressive or acute appearing osseous lesions. There is a 1.8 cm mass within the right breast. There is a 1.6 cm mass within the right breast (image 67; series 5). There is a 1.7 cm mass with central calcifications right breast (image 73; series 5). Review of the MIP images confirms the above findings. IMPRESSION: 1. No evidence for acute pulmonary embolus. 2. Multifocal bilateral ground-glass nodules which may represent infection/inflammation. Findings raise the possibility of COVID-19 infection. Continued follow-up until resolution is recommended. 3. There is a 7.9 cm cystic mass within the left upper hemithorax. The mass may potentially be originating from the posterior aspect of the mediastinum. The cranial most aspect of the mass extends into the supraclavicular region and abuts the left vertebral artery. Possible soft tissue versus artifact along the superior margin of the mass. Potential considerations include lymphangioma or cystic mediastinal mass. Recommend pulmonary consultation for further evaluation. Consider nonurgent MRI for further characterization as clinically indicated after resolution of the acute symptomatology. 4. Prominent subcentimeter superior mediastinal lymph nodes which may be reactive in etiology. Recommend attention on follow-up. 5. Dilated main pulmonary artery as can be seen with pulmonary arterial hypertension. 6. Multiple right breast masses. Patient needs dedicated mammography and breast ultrasound. 7. These results were called by telephone at the time of interpretation on 10/29/2019 at 11:51 am to provider [facility] , who verbally acknowledged these results. Electronically Signed   By: Lovey Newcomer M.D.   On: 10/29/2019 11:53   DG Chest Portable 1 View  Result Date: 10/28/2019 CLINICAL DATA:  Cough EXAM: PORTABLE CHEST 1 VIEW COMPARISON:  11/12/2011  FINDINGS: The right lung is grossly clear. Large apical, possibly pleural mass on the left measuring at least 9 point 7 cm. Borderline cardiomegaly. Retrocardiac opacity, questionable for hiatal hernia. No pleural effusion or pneumothorax. IMPRESSION: 1. Large left apical, possibly pleural mass measuring at least 9 cm. CT chest recommended for further evaluation. 2. Retrocardiac opacity, questionable for hiatal hernia. This could also be assessed at CT. Electronically Signed   By: Donavan Foil M.D.   On: 10/28/2019  20:19     Assessment & Plan:   Brittany Morgan is a 49 year old woman with history of hypertension who is referred to pulmonary clinic after an ED visit for cough who was found to have a lung mass.   The lung mass is located in the superior aspect of the mediastinum with smooth round borders. Upon review of a chest radiograph from 2013, she had a 1-1.5cm nodule in the left upper lung by the second rib and clavicle with smooth round borders. The radiology reports that this mass could also arise from the posterior mediastinum vs the pleura. Differential includes non-malignant tumors such as neurofibroma or schwannoma vs malignant tumors.   We had a long discussion of the possibilities and that this could be cancer. I showed the patient and her husband the CT scan images. I discussed the case with thoracic surgery and they agreed with proceeding with PET scan as the next step.       The patient is scheduled for PET scan on 11/07/19.  In regards to the breast masses, we will await the results of the PET scan and arrange further breast imaging if necessary.   Freddi Starr, MD 11/03/2019

## 2019-11-07 ENCOUNTER — Ambulatory Visit (HOSPITAL_COMMUNITY)
Admission: RE | Admit: 2019-11-07 | Discharge: 2019-11-07 | Disposition: A | Discharge status: Home / Self Care | Payer: Self-pay | Source: Ambulatory Visit | Attending: Pulmonary Disease | Admitting: Pulmonary Disease

## 2019-11-07 ENCOUNTER — Other Ambulatory Visit: Payer: Self-pay

## 2019-11-07 DIAGNOSIS — R918 Other nonspecific abnormal finding of lung field: Secondary | ICD-10-CM | POA: Insufficient documentation

## 2019-11-07 LAB — GLUCOSE, CAPILLARY: Glucose-Capillary: 100 mg/dL — ABNORMAL HIGH (ref 70–99)

## 2019-11-07 MED ORDER — FLUDEOXYGLUCOSE F - 18 (FDG) INJECTION
10.8000 | Freq: Once | INTRAVENOUS | Status: AC | PRN
  Administered 2019-11-07: 10.8 via INTRAVENOUS

## 2019-11-08 ENCOUNTER — Telehealth: Payer: Self-pay | Admitting: Pulmonary Disease

## 2019-11-08 DIAGNOSIS — J9859 Other diseases of mediastinum, not elsewhere classified: Secondary | ICD-10-CM

## 2019-11-08 NOTE — Telephone Encounter (Signed)
Spoke with Mr Brittany Morgan. Discussed findings. I am not familiar with case but PET is reassuring. Dr. Erin Fulling can you please call at your earliest convenience to discuss in detail.

## 2019-11-08 NOTE — Telephone Encounter (Signed)
Received an incoming call from patient's husband, Shanina Kepple, no DPR on file at this time.  He states he received a call from Dr. Erin Fulling and was returning the call regarding imaging results.  Advised that Dr. Erin Fulling is not in the office and I would get the message back to one of our NP, however, they are seeing patients in the clinic and someone will return his call as soon as possible.  He verbalized understanding.  He can be reached at [phone].  Beth, Is this something that you can address?  I will copy Dr. Erin Fulling on this as well.  Thank you.

## 2019-11-09 ENCOUNTER — Encounter: Payer: Self-pay | Admitting: *Deleted

## 2019-11-09 ENCOUNTER — Other Ambulatory Visit: Payer: Self-pay | Admitting: *Deleted

## 2019-11-09 NOTE — Progress Notes (Signed)
The proposed treatment discussed in cancer conference is for discussion purpose only and is not a binding recommendation.  The patient was not seen nor physically examined and was not present for their treatment options.  Therefore, final treatment plans cannot be decided.

## 2019-11-09 NOTE — Telephone Encounter (Signed)
Dr. Erin Fulling spoke with patient and husband.    Please schedule patient for an MRI thoracic Spine w/ and w/o contrast. Order has been placed. Please let the patient know when the scan is scheduled.   Thanks,  Wille Glaser

## 2019-11-13 NOTE — Telephone Encounter (Signed)
Scheduled and pt aware.

## 2019-11-14 ENCOUNTER — Institutional Professional Consult (permissible substitution): Payer: Self-pay | Admitting: Emergency Medicine

## 2019-11-17 ENCOUNTER — Ambulatory Visit (HOSPITAL_COMMUNITY)
Admission: RE | Admit: 2019-11-17 | Discharge: 2019-11-17 | Disposition: A | Discharge status: Home / Self Care | Payer: Self-pay | Source: Ambulatory Visit | Attending: Pulmonary Disease | Admitting: Pulmonary Disease

## 2019-11-17 ENCOUNTER — Other Ambulatory Visit: Payer: Self-pay

## 2019-11-17 DIAGNOSIS — J9859 Other diseases of mediastinum, not elsewhere classified: Secondary | ICD-10-CM | POA: Insufficient documentation

## 2019-11-17 MED ORDER — GADOBUTROL 1 MMOL/ML IV SOLN
9.0000 mL | Freq: Once | INTRAVENOUS | Status: AC | PRN
  Administered 2019-11-17: 9 mL via INTRAVENOUS

## 2019-11-23 ENCOUNTER — Telehealth: Payer: Self-pay | Admitting: Pulmonary Disease

## 2019-11-23 NOTE — Telephone Encounter (Signed)
I spoke with Patrick Jupiter, patient's husband, and informed him that we reviewed the MRI spine at thoracic conference this morning. Discussed possible need for thoracic surgery and neurosurgery involvement in the surgical planning. The thoracic surgery team will be reaching out to the patient to schedule an office appointment.

## 2019-11-23 NOTE — Telephone Encounter (Signed)
Spoke with Spouse and he states he is returning a call to Dr Erin Fulling regarding MRI results  Please advise thanks

## 2019-11-28 ENCOUNTER — Other Ambulatory Visit: Payer: Self-pay

## 2019-11-28 ENCOUNTER — Institutional Professional Consult (permissible substitution) (INDEPENDENT_AMBULATORY_CARE_PROVIDER_SITE_OTHER): Payer: Self-pay | Admitting: Cardiothoracic Surgery

## 2019-11-28 VITALS — BP 133/90 | HR 88 | Temp 97.6°F | Resp 20 | Ht 66.0 in | Wt 200.0 lb

## 2019-11-28 DIAGNOSIS — R918 Other nonspecific abnormal finding of lung field: Secondary | ICD-10-CM

## 2019-11-28 NOTE — Progress Notes (Signed)
[address]       [place] [address]             [phone]                    Laini D Rodriguez [place] Medical Record #[id number] Date of Birth: [birth year]  Referring: Freddi Starr, MD Primary Care: Patient, No Pcp Per Primary Cardiologist: No primary care provider on file.  Chief Complaint:    Chief Complaint  Patient presents with  . Lung Mass    Surgical consult, PET Scan 11/07/19, CTA Chest 10/29/19, MR Thoracic spine 11/17/19    History of Present Illness:    NORLEEN XIE 49 y.o. female is seen in the office  today for evaluation of a left upper chest posterior mediastinal next cystic lesion.  Evaluation started when the patient went to the emergency room "feeling " with elevated blood pressure.  A CT scan of the chest was done while she was in the emergency room that showed a large left upper chest cystic structure, further evaluation with PET scan and MRI of the chest has been done.  The patient has no previous history of smoking.  She denies any complaint of pain in the left chest, has no neurologic complaints in the lower extremities or paresthesias on the left chest she has had some mild shortness of breath with exertion.  She has 3 children the middle child now at age 32 had a neuroblastoma 2 years, treated with adrenalectomy, whole body radiation and bone marrow transplant.  Subsequently developed breast cancer.       Current Activity/ Functional Status:  Patient is independent with mobility/ambulation, transfers, ADL's, IADL's.   Zubrod Score: At the time of surgery this patient's most appropriate activity status/level should be described as: [x]     0    Normal activity, no symptoms []     1    Restricted in physical strenuous activity but ambulatory, able to do out light work []     2    Ambulatory and capable of self care, unable to do work activities, up and about               >50 % of waking hours                              []      3    Only limited self care, in bed greater than 50% of waking hours []     4    Completely disabled, no self care, confined to bed or chair []     5    Moribund   Past Medical History:  Diagnosis Date  . Anxiety   . Hypertension     Past Surgical History:  Procedure Laterality Date  . APPENDECTOMY      Family History  Problem Relation Age of Onset  . Hyperlipidemia Mother   . Heart disease Mother        AMI; s/p stenting  . Stroke Father 29  . Hypertension Father      Social History   Tobacco Use  Smoking Status Passive Smoke Exposure - Never Smoker  Smokeless Tobacco Never Used  Tobacco Comment   mother smokes- is frequently around mother.     Social History   Substance and Sexual Activity  Alcohol Use No  . Alcohol/week: 0.0 standard drinks  No Known Allergies  Current Outpatient Medications  Medication Sig Dispense Refill  . albuterol (VENTOLIN HFA) 108 (90 Base) MCG/ACT inhaler Inhale 1-2 puffs into the lungs every 6 (six) hours as needed for wheezing or shortness of breath. 18 g 0  . FLUoxetine (PROZAC) 10 MG tablet Take 1 tablet (10 mg total) by mouth daily. 90 tablet 1  . hydrochlorothiazide (HYDRODIURIL) 12.5 MG tablet Take 1 tablet (12.5 mg total) by mouth daily. 90 tablet 0  . lisinopril (ZESTRIL) 10 MG tablet Take 1 tablet (10 mg total) by mouth daily. 90 tablet 0  . ondansetron (ZOFRAN ODT) 4 MG disintegrating tablet Take 1 tablet (4 mg total) by mouth every 8 (eight) hours as needed for nausea or vomiting. 20 tablet 0   No current facility-administered medications for this visit.    Pertinent items are noted in HPI.   Review of Systems:     Cardiac Review of Systems: [Y] = yes  or   [ N ] = no   Chest Pain [ n   ]  Resting SOB [  n ] Exertional SOB  [ mild ]  Orthopnea [n ]   Pedal Edema [ n  ]    Palpitations [ n ] Syncope  [n  ]   Presyncope [ n  ]   General Review of Systems: [Y] = yes [  ]=no Constitional: recent weight change [  ];   Wt loss over the last 3 months [   ] anorexia [  ]; fatigue [  ]; nausea [  ]; night sweats [  ]; fever [  ]; or chills [  ];           Eye : blurred vision [  ]; diplopia [   ]; vision changes [  ];  Amaurosis fugax[  ]; Resp: cough [  ];  wheezing[  ];  hemoptysis[  ]; shortness of breath[  ]; paroxysmal nocturnal dyspnea[  ]; dyspnea on exertion[  ]; or orthopnea[  ];  GI:  gallstones[  ], vomiting[  ];  dysphagia[  ]; melena[  ];  hematochezia [  ]; heartburn[  ];   Hx of  Colonoscopy[  ]; GU: kidney stones [  ]; hematuria[  ];   dysuria [  ];  nocturia[  ];  history of     obstruction [  ]; urinary frequency [  ]             Skin: rash, swelling[  ];, hair loss[  ];  peripheral edema[  ];  or itching[  ]; Musculosketetal: myalgias[  ];  joint swelling[  ];  joint erythema[  ];  joint pain[  ];  back pain[  ];  Heme/Lymph: bruising[  ];  bleeding[  ];  anemia[  ];  Neuro: TIA[  ];  headaches[  ];  stroke[  ];  vertigo[  ];  seizures[  ];   paresthesias[  ];  difficulty walking[  ];  Psych:depression[  ]; anxiety[  ];  Endocrine: diabetes[  ];  thyroid dysfunction[  ];  Immunizations: Flu up to date [ ? ]; Pneumococcal up to date [ ? ]; COVID-19 vacination completed  [ y ]  Other:     PHYSICAL EXAMINATION: BP 133/90   Pulse 88   Temp 97.6 F (36.4 C) (Skin)   Resp 20   Ht 5\' 6"  (1.676 m)   Wt 200 lb (90.7 kg)   SpO2 100% Comment: RA  BMI 32.28 kg/m  General appearance: alert, cooperative and no distress Head: Normocephalic, without obvious abnormality, atraumatic Neck: no adenopathy, no carotid bruit, no JVD, supple, symmetrical, trachea midline and thyroid not enlarged, symmetric, no tenderness/mass/nodules Lymph nodes: Cervical, supraclavicular, and axillary nodes normal. Resp: clear to auscultation bilaterally Back: symmetric, no curvature. ROM normal. No CVA tenderness. Cardio: regular rate and rhythm, S1, S2 normal, no murmur, click, rub or gallop GI: soft, non-tender;  bowel sounds normal; no masses,  no organomegaly Extremities: extremities normal, atraumatic, no cyanosis or edema and Homans sign is negative, no sign of DVT Neurologic: Grossly normal  Diagnostic Studies & Laboratory data:     Recent Radiology Findings:   MR THORACIC SPINE W WO CONTRAST  Result Date: 11/18/2019 CLINICAL DATA:  Paraspinal mass EXAM: MRI THORACIC WITHOUT AND WITH CONTRAST TECHNIQUE: Multiplanar and multiecho pulse sequences of the thoracic spine were obtained without and with intravenous contrast. CONTRAST:  42mL GADAVIST GADOBUTROL 1 MMOL/ML IV SOLN COMPARISON:  CTA chest 10/29/2019 FINDINGS: MRI THORACIC SPINE FINDINGS Alignment:  Physiologic. Vertebrae: No fracture, evidence of discitis, or bone lesion. Cord:  Normal signal and morphology. Paraspinal and other soft tissues: There is a predominantly cystic mass filling the upper half the left hemithorax. There is a component of the mass arising from the left T2 neural foramen. The most medial intraforaminal component of the mass shows heterogeneous moderate contrast enhancement. In the left posterior paraspinous muscles, there is a heterogeneously contrast-enhancing nodule that measures 1.3 x 1.7 x 2.9 cm. Disc levels: There is no spinal canal stenosis. Aside from the left T2 neural foramen, there is no stenosis. At T11-12 there is a small left subarticular disc protrusion that mildly narrows the ventral thecal sac IMPRESSION: 1. Predominantly cystic mass filling the upper half of the left hemithorax and likely arising from the left T2 neural foramen. 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 Venolymphatic malformation is less likely due to the foraminal component. 2. Heterogeneously enhancing nodule within the left paraspinal muscles. This is likely an extension of the same process as there is a taillike component extending toward the left T2 neural foramen. 3. No spinal canal stenosis.  Electronically Signed   By: Ulyses Jarred M.D.   On: 11/18/2019 23:18   NM PET Image Initial (PI) Skull Base To Thigh  Result Date: 11/07/2019 CLINICAL DATA:  Initial treatment strategy for cystic left chest mass on CT. EXAM: NUCLEAR MEDICINE PET SKULL BASE TO THIGH TECHNIQUE: 10.8 mCi F-18 FDG was injected intravenously. Full-ring PET imaging was performed from the skull base to thigh after the radiotracer. CT data was obtained and used for attenuation correction and anatomic localization. Fasting blood glucose: 100 mg/dl COMPARISON:  Chest CT 10/29/2019 FINDINGS: Mediastinal blood pool activity: SUV max 2.9 Liver activity: SUV max NA NECK: No areas of abnormal hypermetabolism. Incidental CT findings: No cervical adenopathy. CHEST: Similar size of a left chest minimally complex cystic lesion, favored to arise from the mediastinum. Example 8.5 x 7.9 cm on 57/4. Some areas within this lesion demonstrate low-level hypermetabolism, including at a S.U.V. max of 3.5. A low periesophageal node measures 6 mm and a S.U.V. max of 2.9 on 90/4. Incidental CT findings: Deferred to recent diagnostic CT. The right breast lesions described on that exam are not significantly hypermetabolic. The areas of infectious ill-defined ground-glass nodularity are improved, given differences in technique. ABDOMEN/PELVIS: No abdominopelvic parenchymal or nodal hypermetabolism. Incidental CT findings: Normal adrenal glands. Colonic stool burden suggests constipation. Appendectomy.  Globular uterus, suggesting underlying fibroids. SKELETON: No abnormal marrow activity. Incidental CT findings: none IMPRESSION: 1. Low-level hypermetabolism corresponding to portions of the cystic mass within the left hemithorax, favored to arise from the mediastinum. Differential considerations include bronchogenic cyst, GI duplication cyst, thymic cyst, or lymphangioma. Consider sampling/aspiration. If this is not performed, consider follow-up with pre and  post-contrast MRI at 3 months to confirm size stability and further evaluate morphology. 2. Mild hypermetabolism corresponding to a low periesophageal node, favored to be reactive. 3. Incidental findings, including: Fibroid uterus. Probable constipation. Electronically Signed   By: Abigail Miyamoto M.D.   On: 11/07/2019 09:21     I have independently reviewed the above radiology studies  and reviewed the findings with the patient.   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  With the size of the mass I recommended that we proceed with surgical resection.  With the involvement of the T2 neuroforamen I have asked Dr Marlene Bast to review the films as it may take a combined neurosurgical thoracic surgical approach to resect.   We will make arrangements for the patient to be seen if after review of the films the posterior and lateral approach was felt to be less    Petra Kuba of surgery potential procedures been explained to the patient and her husband-before scheduling the procedure we will review neurosurgery in person   Grace Isaac MD      [address] ,[place] [address] Office [phone]     11/28/2019 3:56 PM

## 2019-12-06 ENCOUNTER — Ambulatory Visit: Payer: Self-pay | Admitting: Cardiothoracic Surgery

## 2019-12-06 ENCOUNTER — Other Ambulatory Visit: Payer: Self-pay | Admitting: Neurosurgery

## 2019-12-07 ENCOUNTER — Encounter: Payer: Self-pay | Admitting: *Deleted

## 2019-12-07 ENCOUNTER — Other Ambulatory Visit: Payer: Self-pay

## 2019-12-07 ENCOUNTER — Ambulatory Visit (INDEPENDENT_AMBULATORY_CARE_PROVIDER_SITE_OTHER): Payer: Self-pay | Admitting: Cardiothoracic Surgery

## 2019-12-07 ENCOUNTER — Other Ambulatory Visit: Payer: Self-pay | Admitting: *Deleted

## 2019-12-07 VITALS — BP 140/95 | HR 88 | Temp 97.9°F | Resp 20 | Ht 66.0 in | Wt 213.0 lb

## 2019-12-07 DIAGNOSIS — D3614 Benign neoplasm of peripheral nerves and autonomic nervous system of thorax: Secondary | ICD-10-CM | POA: Insufficient documentation

## 2019-12-07 DIAGNOSIS — R918 Other nonspecific abnormal finding of lung field: Secondary | ICD-10-CM

## 2019-12-07 DIAGNOSIS — J9859 Other diseases of mediastinum, not elsewhere classified: Secondary | ICD-10-CM

## 2019-12-07 NOTE — Progress Notes (Signed)
[address]       [name]             [phone]                    Emrys D [facility] Medical Record #[id number] Date of Birth: Jul 15, 1970  Referring: Freddi Starr, MD Primary Care: Patient, No Pcp Per Primary Cardiologist: No primary care provider on file.  Chief Complaint:    Chief Complaint  Patient presents with  . Lung Mass    further discuss scheduling surgery, after seeing Dr Marlene Bast    History of Present Illness:    Brittany Morgan 49 y.o. female is seen in the office  today for evaluation of a left upper chest posterior mediastinal next cystic lesion.  Evaluation started when the patient went to the emergency room "feeling " with elevated blood pressure.  A CT scan of the chest was done while she was in the emergency room that showed a large left upper chest cystic structure, further evaluation with PET scan and MRI of the chest has been done.  The patient has no previous history of smoking.  She denies any complaint of pain in the left chest, has no neurologic complaints in the lower extremities or paresthesias on the left chest she has had some mild shortness of breath with exertion.  She has 3 children the middle child now at age 60 had a neuroblastoma 2 years, treated with adrenalectomy, whole body radiation and bone marrow transplant.  Subsequently developed breast cancer.    Since last seen the patient has been for neurosurgical evaluation with Dr. Kathyrn Sheriff.   Current Activity/ Functional Status:  Patient is independent with mobility/ambulation, transfers, ADL's, IADL's.   Zubrod Score: At the time of surgery this patient's most appropriate activity status/level should be described as: [x]     0    Normal activity, no symptoms []     1    Restricted in physical strenuous activity but ambulatory, able to do out light work []     2    Ambulatory and capable of self care, unable to do work activities, up  and about               >50 % of waking hours                              []     3    Only limited self care, in bed greater than 50% of waking hours []     4    Completely disabled, no self care, confined to bed or chair []     5    Moribund   Past Medical History:  Diagnosis Date  . Anxiety   . Hypertension     Past Surgical History:  Procedure Laterality Date  . APPENDECTOMY      Family History  Problem Relation Age of Onset  . Hyperlipidemia Mother   . Heart disease Mother        AMI; s/p stenting  . Stroke Father 40  . Hypertension Father      Social History   Tobacco Use  Smoking Status Passive Smoke Exposure - Never Smoker  Smokeless Tobacco Never Used  Tobacco Comment   mother smokes- is frequently around mother.     Social History   Substance and Sexual Activity  Alcohol Use No  .  Alcohol/week: 0.0 standard drinks     No Known Allergies  Current Outpatient Medications  Medication Sig Dispense Refill  . albuterol (VENTOLIN HFA) 108 (90 Base) MCG/ACT inhaler Inhale 1-2 puffs into the lungs every 6 (six) hours as needed for wheezing or shortness of breath. 18 g 0  . FLUoxetine (PROZAC) 10 MG tablet Take 1 tablet (10 mg total) by mouth daily. 90 tablet 1  . hydrochlorothiazide (HYDRODIURIL) 12.5 MG tablet Take 1 tablet (12.5 mg total) by mouth daily. 90 tablet 0  . lisinopril (ZESTRIL) 10 MG tablet Take 1 tablet (10 mg total) by mouth daily. 90 tablet 0  . ondansetron (ZOFRAN ODT) 4 MG disintegrating tablet Take 1 tablet (4 mg total) by mouth every 8 (eight) hours as needed for nausea or vomiting. 20 tablet 0   No current facility-administered medications for this visit.    Pertinent items are noted in HPI.   Review of Systems:     Cardiac Review of Systems: [Y] = yes  or   [ N ] = no   Chest Pain [ n   ]  Resting SOB [  n ] Exertional SOB  [ mild ]  Orthopnea [n ]   Pedal Edema [ n  ]    Palpitations [ n ] Syncope  [n  ]   Presyncope [ n   ]   General Review of Systems: [Y] = yes [  ]=no Constitional: recent weight change [  ];  Wt loss over the last 3 months [   ] anorexia [  ]; fatigue [  ]; nausea [  ]; night sweats [  ]; fever [  ]; or chills [  ];           Eye : blurred vision [  ]; diplopia [   ]; vision changes [  ];  Amaurosis fugax[  ]; Resp: cough [  ];  wheezing[  ];  hemoptysis[  ]; shortness of breath[  ]; paroxysmal nocturnal dyspnea[  ]; dyspnea on exertion[  ]; or orthopnea[  ];  GI:  gallstones[  ], vomiting[  ];  dysphagia[  ]; melena[  ];  hematochezia [  ]; heartburn[  ];   Hx of  Colonoscopy[  ]; GU: kidney stones [  ]; hematuria[  ];   dysuria [  ];  nocturia[  ];  history of     obstruction [  ]; urinary frequency [  ]             Skin: rash, swelling[  ];, hair loss[  ];  peripheral edema[  ];  or itching[  ]; Musculosketetal: myalgias[  ];  joint swelling[  ];  joint erythema[  ];  joint pain[  ];  back pain[  ];  Heme/Lymph: bruising[  ];  bleeding[  ];  anemia[  ];  Neuro: TIA[  ];  headaches[  ];  stroke[  ];  vertigo[  ];  seizures[  ];   paresthesias[  ];  difficulty walking[  ];  Psych:depression[  ]; anxiety[  ];  Endocrine: diabetes[  ];  thyroid dysfunction[  ];  Immunizations: Flu up to date [ ? ]; Pneumococcal up to date [ ? ]; COVID-19 vacination completed  [ y ]  Other:     PHYSICAL EXAMINATION: BP (!) 140/95   Pulse 88   Temp 97.9 F (36.6 C) (Skin)   Resp 20   Ht 5\' 6"  (1.676 m)   Wt 213  lb (96.6 kg)   SpO2 98% Comment: RA  BMI 34.38 kg/m  General appearance: alert, cooperative and no distress Head: Normocephalic, without obvious abnormality, atraumatic Lymph nodes: Cervical, supraclavicular, and axillary nodes normal. Resp: clear to auscultation bilaterally Cardio: regular rate and rhythm, S1, S2 normal, no murmur, click, rub or gallop GI: soft, non-tender; bowel sounds normal; no masses,  no organomegaly Extremities: extremities normal, atraumatic, no cyanosis or  edema Neurologic: Grossly normal  Diagnostic Studies & Laboratory data:     Recent Radiology Findings:   MR THORACIC SPINE W WO CONTRAST  Result Date: 11/18/2019 CLINICAL DATA:  Paraspinal mass EXAM: MRI THORACIC WITHOUT AND WITH CONTRAST TECHNIQUE: Multiplanar and multiecho pulse sequences of the thoracic spine were obtained without and with intravenous contrast. CONTRAST:  62mL GADAVIST GADOBUTROL 1 MMOL/ML IV SOLN COMPARISON:  CTA chest 10/29/2019 FINDINGS: MRI THORACIC SPINE FINDINGS Alignment:  Physiologic. Vertebrae: No fracture, evidence of discitis, or bone lesion. Cord:  Normal signal and morphology. Paraspinal and other soft tissues: There is a predominantly cystic mass filling the upper half the left hemithorax. There is a component of the mass arising from the left T2 neural foramen. The most medial intraforaminal component of the mass shows heterogeneous moderate contrast enhancement. In the left posterior paraspinous muscles, there is a heterogeneously contrast-enhancing nodule that measures 1.3 x 1.7 x 2.9 cm. Disc levels: There is no spinal canal stenosis. Aside from the left T2 neural foramen, there is no stenosis. At T11-12 there is a small left subarticular disc protrusion that mildly narrows the ventral thecal sac IMPRESSION: 1. Predominantly cystic mass filling the upper half of the left hemithorax and likely arising from the left T2 neural foramen. 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 Venolymphatic malformation is less likely due to the foraminal component. 2. Heterogeneously enhancing nodule within the left paraspinal muscles. This is likely an extension of the same process as there is a taillike component extending toward the left T2 neural foramen. 3. No spinal canal stenosis. Electronically Signed   By: Ulyses Jarred M.D.   On: 11/18/2019 23:18   NM PET Image Initial (PI) Skull Base To Thigh  Result Date: 11/07/2019 CLINICAL DATA:   Initial treatment strategy for cystic left chest mass on CT. EXAM: NUCLEAR MEDICINE PET SKULL BASE TO THIGH TECHNIQUE: 10.8 mCi F-18 FDG was injected intravenously. Full-ring PET imaging was performed from the skull base to thigh after the radiotracer. CT data was obtained and used for attenuation correction and anatomic localization. Fasting blood glucose: 100 mg/dl COMPARISON:  Chest CT 10/29/2019 FINDINGS: Mediastinal blood pool activity: SUV max 2.9 Liver activity: SUV max NA NECK: No areas of abnormal hypermetabolism. Incidental CT findings: No cervical adenopathy. CHEST: Similar size of a left chest minimally complex cystic lesion, favored to arise from the mediastinum. Example 8.5 x 7.9 cm on 57/4. Some areas within this lesion demonstrate low-level hypermetabolism, including at a S.U.V. max of 3.5. A low periesophageal node measures 6 mm and a S.U.V. max of 2.9 on 90/4. Incidental CT findings: Deferred to recent diagnostic CT. The right breast lesions described on that exam are not significantly hypermetabolic. The areas of infectious ill-defined ground-glass nodularity are improved, given differences in technique. ABDOMEN/PELVIS: No abdominopelvic parenchymal or nodal hypermetabolism. Incidental CT findings: Normal adrenal glands. Colonic stool burden suggests constipation. Appendectomy. Globular uterus, suggesting underlying fibroids. SKELETON: No abnormal marrow activity. Incidental CT findings: none IMPRESSION: 1. Low-level hypermetabolism corresponding to portions of the cystic mass within the  left hemithorax, favored to arise from the mediastinum. Differential considerations include bronchogenic cyst, GI duplication cyst, thymic cyst, or lymphangioma. Consider sampling/aspiration. If this is not performed, consider follow-up with pre and post-contrast MRI at 3 months to confirm size stability and further evaluate morphology. 2. Mild hypermetabolism corresponding to a low periesophageal node, favored to  be reactive. 3. Incidental findings, including: Fibroid uterus. Probable constipation. Electronically Signed   By: Abigail Miyamoto M.D.   On: 11/07/2019 09:21     I have independently reviewed the above radiology studies  and reviewed the findings with the patient.   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  With the size of the mass I recommended that we proceed with surgical resection.  With the involvement of the T2 neuroforamen  Dr Marlene Bast has reviewed the films and seen the patient and discussed with her the surgical approach first to intraforaminal mass to the left T2.  I have discussed with him proceeding with left video-assisted thoracoscopy possible thoracotomy with resection of the mass.  The patient was concerned about her lack of insurance currently and make some plans to apply for health insurance in the near future.  Tentatively she is scheduled for surgery October 5  Grace Isaac MD      [address] [place] [address] Office [phone]     12/07/2019 2:04 PM

## 2019-12-14 NOTE — Progress Notes (Signed)
Your procedure is scheduled on Tuesday, October 5th.  Report to [facility] Main Entrance "A" at 5:30 A.M., and check in at the Admitting office.  Call this number if you have problems the morning of surgery:  [phone]  Call [phone] if you have any questions prior to your surgery date Monday-Friday 8am-4pm   Remember:  Do not eat or drink after midnight the night before your surgery    Take these medicines the morning of surgery with A SIP OF WATER  FLUoxetine (PROZAC)   If needed: albuterol (VENTOLIN HFA) 108 (90 Base),  ondansetron (ZOFRAN ODT)    As of today, STOP taking any Aspirin (unless otherwise instructed by your surgeon) Aleve, Naproxen, Ibuprofen, Motrin, Advil, Goody's, BC's, all herbal medications, fish oil, and all vitamins.                     Do not wear jewelry, make up, or nail polish            Do not wear lotions, powders, perfumes, or deodorant.            Do not shave 48 hours prior to surgery.              Do not bring valuables to the hospital.            [facility] is not responsible for any belongings or valuables.  Do NOT Smoke (Tobacco/Vaping) or drink Alcohol 24 hours prior to your procedure If you use a CPAP at night, you may bring all equipment for your overnight stay.   Contacts, glasses, dentures or bridgework may not be worn into surgery.      For patients admitted to the hospital, discharge time will be determined by your treatment team.   Patients discharged the day of surgery will not be allowed to drive home, and someone needs to stay with them for 24 hours.  Special instructions:   [place]- Preparing For Surgery  Before surgery, you can play an important role. Because skin is not sterile, your skin needs to be as free of germs as possible. You can reduce the number of germs on your skin by washing with CHG (chlorahexidine gluconate) Soap before surgery.  CHG is an antiseptic cleaner which kills germs and bonds with the skin to  continue killing germs even after washing.    Oral Hygiene is also important to reduce your risk of infection.  Remember - BRUSH YOUR TEETH THE MORNING OF SURGERY WITH YOUR REGULAR TOOTHPASTE  Please do not use if you have an allergy to CHG or antibacterial soaps. If your skin becomes reddened/irritated stop using the CHG.  Do not shave (including legs and underarms) for at least 48 hours prior to first CHG shower. It is OK to shave your face.  Please follow these instructions carefully.   1. Shower the NIGHT BEFORE SURGERY and the MORNING OF SURGERY with CHG Soap.   2. If you chose to wash your hair, wash your hair first as usual with your normal shampoo.  3. After you shampoo, rinse your hair and body thoroughly to remove the shampoo.  4. Use CHG as you would any other liquid soap. You can apply CHG directly to the skin and wash gently with a scrungie or a clean washcloth.   5. Apply the CHG Soap to your body ONLY FROM THE NECK DOWN.  Do not use on open wounds or open sores. Avoid contact with your eyes, ears,  mouth and genitals (private parts). Wash Face and genitals (private parts)  with your normal soap.   6. Wash thoroughly, paying special attention to the area where your surgery will be performed.  7. Thoroughly rinse your body with warm water from the neck down.  8. DO NOT shower/wash with your normal soap after using and rinsing off the CHG Soap.  9. Pat yourself dry with a CLEAN TOWEL.  10. Wear CLEAN PAJAMAS to bed the night before surgery  11. Place CLEAN SHEETS on your bed the night of your first shower and DO NOT SLEEP WITH PETS.  Day of Surgery: Wear Clean/Comfortable clothing the morning of surgery Do not apply any deodorants/lotions.   Remember to brush your teeth WITH YOUR REGULAR TOOTHPASTE.   Please read over the following fact sheets that you were given.

## 2019-12-15 ENCOUNTER — Other Ambulatory Visit (HOSPITAL_COMMUNITY)
Admission: RE | Admit: 2019-12-15 | Discharge: 2019-12-15 | Disposition: A | Discharge status: Home / Self Care | Payer: Self-pay | Source: Ambulatory Visit | Attending: Neurosurgery | Admitting: Neurosurgery

## 2019-12-15 ENCOUNTER — Encounter (HOSPITAL_COMMUNITY): Payer: Self-pay

## 2019-12-15 ENCOUNTER — Encounter (HOSPITAL_COMMUNITY)
Admission: RE | Admit: 2019-12-15 | Discharge: 2019-12-15 | Disposition: A | Discharge status: Home / Self Care | Payer: Self-pay | Source: Ambulatory Visit | Attending: Neurosurgery | Admitting: Neurosurgery

## 2019-12-15 ENCOUNTER — Ambulatory Visit (HOSPITAL_COMMUNITY)
Admission: RE | Admit: 2019-12-15 | Discharge: 2019-12-15 | Disposition: A | Discharge status: Home / Self Care | Payer: Self-pay | Source: Ambulatory Visit | Attending: Cardiothoracic Surgery | Admitting: Cardiothoracic Surgery

## 2019-12-15 ENCOUNTER — Other Ambulatory Visit: Payer: Self-pay

## 2019-12-15 DIAGNOSIS — J9859 Other diseases of mediastinum, not elsewhere classified: Secondary | ICD-10-CM | POA: Insufficient documentation

## 2019-12-15 DIAGNOSIS — Z01818 Encounter for other preprocedural examination: Secondary | ICD-10-CM | POA: Insufficient documentation

## 2019-12-15 DIAGNOSIS — Z20822 Contact with and (suspected) exposure to covid-19: Secondary | ICD-10-CM | POA: Insufficient documentation

## 2019-12-15 DIAGNOSIS — Z01812 Encounter for preprocedural laboratory examination: Secondary | ICD-10-CM | POA: Insufficient documentation

## 2019-12-15 HISTORY — DX: Dyspnea, unspecified: R06.00

## 2019-12-15 LAB — BLOOD GAS, ARTERIAL
Acid-Base Excess: 0.5 mmol/L (ref 0.0–2.0)
Bicarbonate: 24.5 mmol/L (ref 20.0–28.0)
FIO2: 21
O2 Saturation: 98.3 %
Patient temperature: 37
pCO2 arterial: 39 mmHg (ref 32.0–48.0)
pH, Arterial: 7.414 (ref 7.350–7.450)
pO2, Arterial: 115 mmHg — ABNORMAL HIGH (ref 83.0–108.0)

## 2019-12-15 LAB — CBC
HCT: 34.8 % — ABNORMAL LOW (ref 36.0–46.0)
Hemoglobin: 11.7 g/dL — ABNORMAL LOW (ref 12.0–15.0)
MCH: 29.5 pg (ref 26.0–34.0)
MCHC: 33.6 g/dL (ref 30.0–36.0)
MCV: 87.9 fL (ref 80.0–100.0)
Platelets: 251 10*3/uL (ref 150–400)
RBC: 3.96 MIL/uL (ref 3.87–5.11)
RDW: 13.8 % (ref 11.5–15.5)
WBC: 7.6 10*3/uL (ref 4.0–10.5)
nRBC: 0 % (ref 0.0–0.2)

## 2019-12-15 LAB — PROTIME-INR
INR: 1 (ref 0.8–1.2)
Prothrombin Time: 12.4 seconds (ref 11.4–15.2)

## 2019-12-15 LAB — COMPREHENSIVE METABOLIC PANEL
ALT: 14 U/L (ref 0–44)
AST: 14 U/L — ABNORMAL LOW (ref 15–41)
Albumin: 4.1 g/dL (ref 3.5–5.0)
Alkaline Phosphatase: 43 U/L (ref 38–126)
Anion gap: 9 (ref 5–15)
BUN: 13 mg/dL (ref 6–20)
CO2: 24 mmol/L (ref 22–32)
Calcium: 9.8 mg/dL (ref 8.9–10.3)
Chloride: 107 mmol/L (ref 98–111)
Creatinine, Ser: 0.99 mg/dL (ref 0.44–1.00)
GFR calc Af Amer: >60 mL/min (ref >60)
GFR calc non Af Amer: >60 mL/min (ref >60)
Glucose, Bld: 111 mg/dL — ABNORMAL HIGH (ref 70–99)
Potassium: 3.7 mmol/L (ref 3.5–5.1)
Sodium: 140 mmol/L (ref 135–145)
Total Bilirubin: 0.4 mg/dL (ref 0.3–1.2)
Total Protein: 6.9 g/dL (ref 6.5–8.1)

## 2019-12-15 LAB — URINALYSIS, ROUTINE W REFLEX MICROSCOPIC
Bilirubin Urine: NEGATIVE
Glucose, UA: NEGATIVE mg/dL
Hgb urine dipstick: NEGATIVE
Ketones, ur: NEGATIVE mg/dL
Leukocytes,Ua: NEGATIVE
Nitrite: NEGATIVE
Protein, ur: NEGATIVE mg/dL
Specific Gravity, Urine: 1.01 (ref 1.005–1.030)
pH: 5 (ref 5.0–8.0)

## 2019-12-15 LAB — APTT: aPTT: 31 seconds (ref 24–36)

## 2019-12-15 LAB — SARS CORONAVIRUS 2 (TAT 6-24 HRS): SARS Coronavirus 2: NEGATIVE

## 2019-12-15 LAB — SURGICAL PCR SCREEN
MRSA, PCR: NEGATIVE
Staphylococcus aureus: NEGATIVE

## 2019-12-15 NOTE — Progress Notes (Signed)
PCP - none Cardiologist - na   Chest x-ray - 12/15/19 EKG - 12/15/19 Stress Test - 24 yrs. Ago-normal ECHO - na Cardiac Cath -  na  Sleep Study - na   Fasting Blood Sugar - na   Blood Thinner Instructions:  na Aspirin Instructions: na    COVID TEST-   12/15/19   Anesthesia review:   Patient denies shortness of breath, fever, cough and chest pain at PAT appointment   All instructions explained to the patient, with a verbal understanding of the material. Patient agrees to go over the instructions while at home for a better understanding. Patient also instructed to self quarantine after being tested for COVID-19. The opportunity to ask questions was provided.

## 2019-12-18 NOTE — H&P (Signed)
Chief Complaint   No chief complaint on file.   HPI   HPI: Brittany Morgan is a 49 y.o. female who was found to have a large left-sided chest mass with extension into the left T2 foramen after she presented to the emergency department several weeks ago with concerns of chest pain and elevated blood pressure.   She denies any numbness or tingling along the left chest wall are armpit or paresthesias in the upper extremities, gait instability or bowel/bladder dysfunction.   She presents today for resection of the thoracic tumor as well as the tumor within the chest alongside Dr Servando Snare, [place] [facility]. She is without any concerns.  Patient Active Problem List   Diagnosis Date Noted  . 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 Hyperlipidemia 06/24/2016  . Depressive disorder 06/15/2016  . Hypertensive disorder 06/15/2016    PMH: Past Medical History:  Diagnosis Date  . Anxiety   . Dyspnea    on exertion  . Hypertension     PSH: Past Surgical History:  Procedure Laterality Date  . APPENDECTOMY      No medications prior to admission.    SH: Social History   Tobacco Use  . Smoking status: Passive Smoke Exposure - Never Smoker  . Smokeless tobacco: Never Used  . Tobacco comment: mother smokes- is frequently around mother.   Substance Use Topics  . Alcohol use: No    Alcohol/week: 0.0 standard drinks  . Drug use: No    MEDS: Prior to Admission medications   Medication Sig Start Date End Date Taking? Authorizing Provider  albuterol (VENTOLIN HFA) 108 (90 Base) MCG/ACT inhaler Inhale 1-2 puffs into the lungs every 6 (six) hours as needed for wheezing or shortness of breath. 10/29/19  Yes Joy, Shawn C, PA-C  FLUoxetine (PROZAC) 10 MG tablet Take 1 tablet (10 mg total) by mouth daily. 10/27/19  Yes Jaynee Eagles, PA-C  hydrochlorothiazide (HYDRODIURIL) 12.5 MG tablet Take 1 tablet (12.5 mg total) by mouth daily. 10/27/19  Yes Jaynee Eagles, PA-C  lisinopril (ZESTRIL) 10 MG tablet  Take 1 tablet (10 mg total) by mouth daily. 10/27/19  Yes Jaynee Eagles, PA-C  Multiple Vitamin (MULTIVITAMIN WITH MINERALS) TABS tablet Take 1 tablet by mouth daily.   Yes [provider]  ondansetron (ZOFRAN ODT) 4 MG disintegrating tablet Take 1 tablet (4 mg total) by mouth every 8 (eight) hours as needed for nausea or vomiting. 10/29/19  Yes Joy, Shawn C, PA-C  lisinopril-hydrochlorothiazide (PRINZIDE,ZESTORETIC) 20-12.5 MG tablet TAKE ONE TABLET BY MOUTH ONCE DAILY. Patient not taking: Reported on 10/27/2019 05/20/17 10/27/19  Vanessa Kick, MD    ALLERGY: No Known Allergies  Social History   Tobacco Use  . Smoking status: Passive Smoke Exposure - Never Smoker  . Smokeless tobacco: Never Used  . Tobacco comment: mother smokes- is frequently around mother.   Substance Use Topics  . Alcohol use: No    Alcohol/week: 0.0 standard drinks     Family History  Problem Relation Age of Onset  . Hyperlipidemia Mother   . Heart disease Mother        AMI; s/p stenting  . Stroke Father 28  . Hypertension Father      ROS   ROS  Exam   There were no vitals filed for this visit. General appearance: WDWN, NAD Eyes: No scleral injection Cardiovascular: Regular rate and rhythm without murmurs, rubs, gallops. No edema or variciosities. Distal pulses normal. Pulmonary: Effort normal, non-labored breathing Musculoskeletal:  Muscle tone upper extremities: Normal    Muscle tone lower extremities: Normal    Motor exam: Upper Extremities Deltoid Bicep Tricep Grip  Right 5/5 5/5 5/5 5/5  Left 5/5 5/5 5/5 5/5   Lower Extremity IP Quad PF DF EHL  Right 5/5 5/5 5/5 5/5 5/5  Left 5/5 5/5 5/5 5/5 5/5   Neurological Mental Status:    - Patient is awake, alert, oriented to person, place, month, year, and situation    - Patient is able to give a clear and coherent history.    - No signs of aphasia or neglect Cranial Nerves    - II: Visual Fields are full. PERRL    - III/IV/VI: EOMI  without ptosis or diploplia.     - V: Facial sensation is grossly normal    - VII: Facial movement is symmetric.     - VIII: hearing is intact to voice    - X: Uvula elevates symmetrically    - XI: Shoulder shrug is symmetric.    - XII: tongue is midline without atrophy or fasciculations.  Sensory: Sensation grossly intact to LT  Results - Imaging/Labs   No results found for this or any previous visit (from the past 48 hour(s)).  No results found.  IMAGING: MRI of the thoracic spine was personally reviewed dated 11/17/2019.  This demonstrates a large primarily cystic in the left chest with extension into the left foramen.  The mass appears to respect the lateral aspect of the thecal sac, without entry into the spinal canal proper.  There is no mass effect upon the spinal cord.  There is also a mass within the paraspinal musculature with the same signal intensity, although it is unclear whether this is extension of the chest lesion or a separate satellite lesion.  Impression/Plan   49 y.o. 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  Patient will require removal of the chest lesion, and in order to fully remove the tumor will also need left-sided T2-3 laminectomy with facetectomy, possible pediculotomy for resection of the foraminal component. We will proceed with with T2-3 laminectomy/facetectomy for resection of tumor and subsequent athoracotomy for resection of the tumor within the chest with Dr. Creta Levin.  We have reviewed the indications for surgery, the associated risks, benefits and alternatives at length in the office.  All questions today were answered and consent was obtained.  [name], MD [facility]

## 2019-12-18 NOTE — Anesthesia Preprocedure Evaluation (Addendum)
Anesthesia Evaluation  Patient identified by MRN, date of birth, ID band Patient awake    Reviewed: Allergy & Precautions, H&P , NPO status , Patient's Chart, lab work & pertinent test results  Airway Mallampati: III  TM Distance: >3 FB Neck ROM: Full    Dental no notable dental hx. (+) Teeth Intact, Dental Advisory Given   Pulmonary shortness of breath,    Pulmonary exam normal breath sounds clear to auscultation       Cardiovascular Exercise Tolerance: Good hypertension, Pt. on medications  Rhythm:Regular Rate:Normal     Neuro/Psych Anxiety Depression negative neurological ROS     GI/Hepatic negative GI ROS, Neg liver ROS,   Endo/Other  negative endocrine ROS  Renal/GU negative Renal ROS  negative genitourinary   Musculoskeletal   Abdominal   Peds  Hematology negative hematology ROS (+)   Anesthesia Other Findings   Reproductive/Obstetrics negative OB ROS                            Anesthesia Physical Anesthesia Plan  ASA: II  Anesthesia Plan: General   Post-op Pain Management:    Induction: Intravenous  PONV Risk Score and Plan: 4 or greater and Ondansetron, Dexamethasone and Midazolam  Airway Management Planned: Double Lumen EBT  Additional Equipment: Arterial line, CVP and Ultrasound Guidance Line Placement  Intra-op Plan:   Post-operative Plan: Extubation in OR and Possible Post-op intubation/ventilation  Informed Consent: I have reviewed the patients History and Physical, chart, labs and discussed the procedure including the risks, benefits and alternatives for the proposed anesthesia with the patient or authorized representative who has indicated his/her understanding and acceptance.     Dental advisory given  Plan Discussed with: CRNA  Anesthesia Plan Comments:        Anesthesia Quick Evaluation

## 2019-12-19 ENCOUNTER — Encounter (HOSPITAL_COMMUNITY)
Admission: RE | Disposition: A | Discharge status: Home / Self Care | Payer: Self-pay | Source: Home / Self Care | Attending: Cardiothoracic Surgery

## 2019-12-19 ENCOUNTER — Inpatient Hospital Stay (HOSPITAL_COMMUNITY)
Admission: RE | Admit: 2019-12-19 | Discharge: 2019-12-22 | DRG: 501 | Disposition: A | Discharge status: Home / Self Care | Payer: Self-pay | Attending: Cardiothoracic Surgery | Admitting: Cardiothoracic Surgery

## 2019-12-19 ENCOUNTER — Inpatient Hospital Stay (HOSPITAL_COMMUNITY): Payer: Self-pay

## 2019-12-19 ENCOUNTER — Other Ambulatory Visit: Payer: Self-pay

## 2019-12-19 ENCOUNTER — Inpatient Hospital Stay (HOSPITAL_COMMUNITY): Payer: Self-pay | Admitting: Certified Registered Nurse Anesthetist

## 2019-12-19 ENCOUNTER — Encounter (HOSPITAL_COMMUNITY): Payer: Self-pay | Admitting: Neurosurgery

## 2019-12-19 ENCOUNTER — Inpatient Hospital Stay (HOSPITAL_COMMUNITY): Payer: Self-pay | Admitting: Vascular Surgery

## 2019-12-19 DIAGNOSIS — J939 Pneumothorax, unspecified: Secondary | ICD-10-CM

## 2019-12-19 DIAGNOSIS — R222 Localized swelling, mass and lump, trunk: Secondary | ICD-10-CM

## 2019-12-19 DIAGNOSIS — D62 Acute posthemorrhagic anemia: Secondary | ICD-10-CM | POA: Diagnosis not present

## 2019-12-19 DIAGNOSIS — E785 Hyperlipidemia, unspecified: Secondary | ICD-10-CM | POA: Diagnosis present

## 2019-12-19 DIAGNOSIS — Z7722 Contact with and (suspected) exposure to environmental tobacco smoke (acute) (chronic): Secondary | ICD-10-CM | POA: Diagnosis present

## 2019-12-19 DIAGNOSIS — Z419 Encounter for procedure for purposes other than remedying health state, unspecified: Secondary | ICD-10-CM

## 2019-12-19 DIAGNOSIS — Z888 Allergy status to other drugs, medicaments and biological substances status: Secondary | ICD-10-CM

## 2019-12-19 DIAGNOSIS — J9859 Other diseases of mediastinum, not elsewhere classified: Secondary | ICD-10-CM

## 2019-12-19 DIAGNOSIS — I10 Essential (primary) hypertension: Secondary | ICD-10-CM | POA: Diagnosis present

## 2019-12-19 DIAGNOSIS — D3614 Benign neoplasm of peripheral nerves and autonomic nervous system of thorax: Principal | ICD-10-CM | POA: Diagnosis present

## 2019-12-19 DIAGNOSIS — Z823 Family history of stroke: Secondary | ICD-10-CM

## 2019-12-19 DIAGNOSIS — Z83438 Family history of other disorder of lipoprotein metabolism and other lipidemia: Secondary | ICD-10-CM

## 2019-12-19 DIAGNOSIS — Z8249 Family history of ischemic heart disease and other diseases of the circulatory system: Secondary | ICD-10-CM

## 2019-12-19 DIAGNOSIS — Z4682 Encounter for fitting and adjustment of non-vascular catheter: Secondary | ICD-10-CM

## 2019-12-19 DIAGNOSIS — Z09 Encounter for follow-up examination after completed treatment for conditions other than malignant neoplasm: Secondary | ICD-10-CM

## 2019-12-19 HISTORY — PX: VIDEO BRONCHOSCOPY: SHX5072

## 2019-12-19 HISTORY — PX: VIDEO ASSISTED THORACOSCOPY (VATS)/WEDGE RESECTION: SHX6174

## 2019-12-19 HISTORY — PX: LAMINECTOMY: SHX219

## 2019-12-19 LAB — POCT I-STAT, CHEM 8
BUN: 12 mg/dL (ref 6–20)
Calcium, Ion: 1.15 mmol/L (ref 1.15–1.40)
Chloride: 105 mmol/L (ref 98–111)
Creatinine, Ser: 1.2 mg/dL — ABNORMAL HIGH (ref 0.44–1.00)
Glucose, Bld: 185 mg/dL — ABNORMAL HIGH (ref 70–99)
HCT: 31 % — ABNORMAL LOW (ref 36.0–46.0)
Hemoglobin: 10.5 g/dL — ABNORMAL LOW (ref 12.0–15.0)
Potassium: 3.8 mmol/L (ref 3.5–5.1)
Sodium: 141 mmol/L (ref 135–145)
TCO2: 24 mmol/L (ref 22–32)

## 2019-12-19 LAB — POCT I-STAT 7, (LYTES, BLD GAS, ICA,H+H)
Acid-base deficit: 1 mmol/L (ref 0.0–2.0)
Bicarbonate: 26.7 mmol/L (ref 20.0–28.0)
Calcium, Ion: 1.18 mmol/L (ref 1.15–1.40)
HCT: 33 % — ABNORMAL LOW (ref 36.0–46.0)
Hemoglobin: 11.2 g/dL — ABNORMAL LOW (ref 12.0–15.0)
O2 Saturation: 98 %
Patient temperature: 36.5
Potassium: 3.9 mmol/L (ref 3.5–5.1)
Sodium: 142 mmol/L (ref 135–145)
TCO2: 28 mmol/L (ref 22–32)
pCO2 arterial: 54.6 mmHg — ABNORMAL HIGH (ref 32.0–48.0)
pH, Arterial: 7.295 — ABNORMAL LOW (ref 7.350–7.450)
pO2, Arterial: 114 mmHg — ABNORMAL HIGH (ref 83.0–108.0)

## 2019-12-19 LAB — PREPARE RBC (CROSSMATCH)

## 2019-12-19 LAB — ABO/RH: ABO/RH(D): A POS

## 2019-12-19 SURGERY — THORACIC LAMINECTOMY FOR TUMOR
Anesthesia: General | Site: Neck

## 2019-12-19 MED ORDER — KETOROLAC TROMETHAMINE 15 MG/ML IJ SOLN
15.0000 mg | Freq: Four times a day (QID) | INTRAMUSCULAR | Status: AC
  Administered 2019-12-19 – 2019-12-20 (×5): 15 mg via INTRAVENOUS
  Filled 2019-12-19 (×5): qty 1

## 2019-12-19 MED ORDER — FENTANYL CITRATE (PF) 250 MCG/5ML IJ SOLN
INTRAMUSCULAR | Status: AC
  Filled 2019-12-19: qty 5

## 2019-12-19 MED ORDER — LIDOCAINE 2% (20 MG/ML) 5 ML SYRINGE
INTRAMUSCULAR | Status: AC
  Filled 2019-12-19: qty 5

## 2019-12-19 MED ORDER — PHENYLEPHRINE 40 MCG/ML (10ML) SYRINGE FOR IV PUSH (FOR BLOOD PRESSURE SUPPORT)
PREFILLED_SYRINGE | INTRAVENOUS | Status: DC | PRN
  Administered 2019-12-19 (×3): 80 ug via INTRAVENOUS
  Administered 2019-12-19: 40 ug via INTRAVENOUS
  Administered 2019-12-19 (×3): 80 ug via INTRAVENOUS

## 2019-12-19 MED ORDER — BUPIVACAINE HCL (PF) 0.5 % IJ SOLN
INTRAMUSCULAR | Status: AC
  Filled 2019-12-19: qty 30

## 2019-12-19 MED ORDER — FENTANYL CITRATE (PF) 100 MCG/2ML IJ SOLN
25.0000 ug | INTRAMUSCULAR | Status: DC | PRN
  Administered 2019-12-20 (×2): 50 ug via INTRAVENOUS
  Filled 2019-12-19 (×2): qty 2

## 2019-12-19 MED ORDER — CLEVIDIPINE BUTYRATE 0.5 MG/ML IV EMUL
1.0000 mg/h | INTRAVENOUS | Status: DC
  Filled 2019-12-19: qty 50

## 2019-12-19 MED ORDER — LIDOCAINE-EPINEPHRINE 1 %-1:100000 IJ SOLN
INTRAMUSCULAR | Status: AC
  Filled 2019-12-19: qty 1

## 2019-12-19 MED ORDER — LIDOCAINE 2% (20 MG/ML) 5 ML SYRINGE
INTRAMUSCULAR | Status: DC | PRN
  Administered 2019-12-19: 60 mg via INTRAVENOUS

## 2019-12-19 MED ORDER — HYDROCHLOROTHIAZIDE 25 MG PO TABS
12.5000 mg | ORAL_TABLET | Freq: Every day | ORAL | Status: DC
  Administered 2019-12-20 – 2019-12-22 (×3): 12.5 mg via ORAL
  Filled 2019-12-19 (×3): qty 1

## 2019-12-19 MED ORDER — ROCURONIUM BROMIDE 10 MG/ML (PF) SYRINGE
PREFILLED_SYRINGE | INTRAVENOUS | Status: AC
  Filled 2019-12-19: qty 10

## 2019-12-19 MED ORDER — DEXMEDETOMIDINE (PRECEDEX) IN NS 20 MCG/5ML (4 MCG/ML) IV SYRINGE
PREFILLED_SYRINGE | INTRAVENOUS | Status: DC | PRN
  Administered 2019-12-19 (×2): 8 ug via INTRAVENOUS
  Administered 2019-12-19: 4 ug via INTRAVENOUS

## 2019-12-19 MED ORDER — DEXAMETHASONE SODIUM PHOSPHATE 10 MG/ML IJ SOLN
INTRAMUSCULAR | Status: AC
  Filled 2019-12-19: qty 1

## 2019-12-19 MED ORDER — 0.9 % SODIUM CHLORIDE (POUR BTL) OPTIME
TOPICAL | Status: DC | PRN
  Administered 2019-12-19: 2000 mL

## 2019-12-19 MED ORDER — CHLORHEXIDINE GLUCONATE 0.12 % MT SOLN
OROMUCOSAL | Status: AC
  Filled 2019-12-19: qty 15

## 2019-12-19 MED ORDER — LACTATED RINGERS IV SOLN: INTRAVENOUS | Status: DC | PRN

## 2019-12-19 MED ORDER — TRAMADOL HCL 50 MG PO TABS
50.0000 mg | ORAL_TABLET | Freq: Four times a day (QID) | ORAL | Status: DC | PRN
  Administered 2019-12-22: 50 mg via ORAL
  Filled 2019-12-19: qty 1

## 2019-12-19 MED ORDER — FENTANYL CITRATE (PF) 250 MCG/5ML IJ SOLN
INTRAMUSCULAR | Status: DC | PRN
Start: 2019-12-19 — End: 2019-12-19
  Administered 2019-12-19 (×2): 100 ug via INTRAVENOUS
  Administered 2019-12-19: 150 ug via INTRAVENOUS
  Administered 2019-12-19 (×3): 50 ug via INTRAVENOUS
  Administered 2019-12-19 (×2): 100 ug via INTRAVENOUS
  Administered 2019-12-19: 50 ug via INTRAVENOUS

## 2019-12-19 MED ORDER — LABETALOL HCL 5 MG/ML IV SOLN
INTRAVENOUS | Status: DC | PRN
  Administered 2019-12-19 (×2): 5 mg via INTRAVENOUS
  Administered 2019-12-19: 10 mg via INTRAVENOUS

## 2019-12-19 MED ORDER — CEFAZOLIN SODIUM-DEXTROSE 2-4 GM/100ML-% IV SOLN: 2.0000 g | INTRAVENOUS | Status: DC

## 2019-12-19 MED ORDER — MIDAZOLAM HCL 2 MG/2ML IJ SOLN
INTRAMUSCULAR | Status: DC | PRN
  Administered 2019-12-19 (×2): 1 mg via INTRAVENOUS

## 2019-12-19 MED ORDER — OXYCODONE HCL 5 MG PO TABS
5.0000 mg | ORAL_TABLET | ORAL | Status: DC | PRN
  Administered 2019-12-21 – 2019-12-22 (×2): 10 mg via ORAL
  Filled 2019-12-19 (×2): qty 2

## 2019-12-19 MED ORDER — LEVALBUTEROL HCL 0.63 MG/3ML IN NEBU: 0.6300 mg | INHALATION_SOLUTION | Freq: Three times a day (TID) | RESPIRATORY_TRACT | Status: DC | PRN

## 2019-12-19 MED ORDER — ONDANSETRON HCL 4 MG/2ML IJ SOLN
4.0000 mg | Freq: Four times a day (QID) | INTRAMUSCULAR | Status: DC | PRN
  Administered 2019-12-19 – 2019-12-22 (×5): 4 mg via INTRAVENOUS
  Filled 2019-12-19 (×5): qty 2

## 2019-12-19 MED ORDER — BUPIVACAINE HCL (PF) 0.5 % IJ SOLN
INTRAMUSCULAR | Status: DC | PRN
  Administered 2019-12-19: 5 mL

## 2019-12-19 MED ORDER — CEFAZOLIN SODIUM 1 G IJ SOLR
INTRAMUSCULAR | Status: AC
  Filled 2019-12-19: qty 20

## 2019-12-19 MED ORDER — BISACODYL 5 MG PO TBEC
10.0000 mg | DELAYED_RELEASE_TABLET | Freq: Every day | ORAL | Status: DC
  Administered 2019-12-20 – 2019-12-21 (×2): 10 mg via ORAL
  Filled 2019-12-19 (×2): qty 2

## 2019-12-19 MED ORDER — CHLORHEXIDINE GLUCONATE CLOTH 2 % EX PADS: 6.0000 | MEDICATED_PAD | Freq: Once | CUTANEOUS | Status: DC

## 2019-12-19 MED ORDER — LISINOPRIL 10 MG PO TABS
10.0000 mg | ORAL_TABLET | Freq: Every day | ORAL | Status: DC
  Administered 2019-12-20: 10 mg via ORAL
  Filled 2019-12-19: qty 1

## 2019-12-19 MED ORDER — ONDANSETRON HCL 4 MG/2ML IJ SOLN
INTRAMUSCULAR | Status: DC | PRN
  Administered 2019-12-19: 4 mg via INTRAVENOUS

## 2019-12-19 MED ORDER — DEXAMETHASONE SODIUM PHOSPHATE 10 MG/ML IJ SOLN
INTRAMUSCULAR | Status: DC | PRN
  Administered 2019-12-19: 10 mg via INTRAVENOUS

## 2019-12-19 MED ORDER — LIDOCAINE-EPINEPHRINE 1 %-1:100000 IJ SOLN
INTRAMUSCULAR | Status: DC | PRN
  Administered 2019-12-19: 5 mL

## 2019-12-19 MED ORDER — MIDAZOLAM HCL 2 MG/2ML IJ SOLN
INTRAMUSCULAR | Status: AC
  Filled 2019-12-19: qty 2

## 2019-12-19 MED ORDER — PHENYLEPHRINE HCL-NACL 10-0.9 MG/250ML-% IV SOLN
INTRAVENOUS | Status: DC | PRN
  Administered 2019-12-19: 50 ug/min via INTRAVENOUS

## 2019-12-19 MED ORDER — CEFAZOLIN SODIUM-DEXTROSE 2-4 GM/100ML-% IV SOLN
2.0000 g | INTRAVENOUS | Status: AC
  Administered 2019-12-19 (×3): 2 g via INTRAVENOUS
  Filled 2019-12-19: qty 100

## 2019-12-19 MED ORDER — BUPIVACAINE LIPOSOME 1.3 % IJ SUSP
20.0000 mL | INTRAMUSCULAR | Status: DC
  Filled 2019-12-19: qty 20

## 2019-12-19 MED ORDER — ADULT MULTIVITAMIN W/MINERALS CH
1.0000 | ORAL_TABLET | Freq: Every day | ORAL | Status: DC
  Administered 2019-12-20 – 2019-12-22 (×3): 1 via ORAL
  Filled 2019-12-19 (×3): qty 1

## 2019-12-19 MED ORDER — CHLORHEXIDINE GLUCONATE CLOTH 2 % EX PADS
6.0000 | MEDICATED_PAD | Freq: Every day | CUTANEOUS | Status: DC
  Administered 2019-12-20 – 2019-12-22 (×3): 6 via TOPICAL

## 2019-12-19 MED ORDER — PROPOFOL 10 MG/ML IV BOLUS
INTRAVENOUS | Status: AC
  Filled 2019-12-19: qty 20

## 2019-12-19 MED ORDER — ACETAMINOPHEN 500 MG PO TABS
1000.0000 mg | ORAL_TABLET | Freq: Four times a day (QID) | ORAL | Status: DC
  Administered 2019-12-20 – 2019-12-22 (×7): 1000 mg via ORAL
  Filled 2019-12-19 (×8): qty 2

## 2019-12-19 MED ORDER — ENOXAPARIN SODIUM 40 MG/0.4ML ~~LOC~~ SOLN: 40.0000 mg | Freq: Every day | SUBCUTANEOUS | Status: DC

## 2019-12-19 MED ORDER — THROMBIN 5000 UNITS EX SOLR
CUTANEOUS | Status: AC
  Filled 2019-12-19: qty 15000

## 2019-12-19 MED ORDER — PROPOFOL 10 MG/ML IV BOLUS
INTRAVENOUS | Status: DC | PRN
  Administered 2019-12-19 (×2): 100 mg via INTRAVENOUS
  Administered 2019-12-19: 200 mg via INTRAVENOUS
  Administered 2019-12-19: 100 mg via INTRAVENOUS

## 2019-12-19 MED ORDER — SUGAMMADEX SODIUM 200 MG/2ML IV SOLN
INTRAVENOUS | Status: DC | PRN
  Administered 2019-12-19: 200 mg via INTRAVENOUS

## 2019-12-19 MED ORDER — POTASSIUM CHLORIDE IN NACL 20-0.9 MEQ/L-% IV SOLN
INTRAVENOUS | Status: DC
  Filled 2019-12-19 (×4): qty 1000

## 2019-12-19 MED ORDER — BACITRACIN ZINC 500 UNIT/GM EX OINT
TOPICAL_OINTMENT | CUTANEOUS | Status: AC
  Filled 2019-12-19: qty 28.35

## 2019-12-19 MED ORDER — ACETAMINOPHEN 500 MG PO TABS
1000.0000 mg | ORAL_TABLET | Freq: Once | ORAL | Status: AC
  Administered 2019-12-19: 1000 mg via ORAL
  Filled 2019-12-19: qty 2

## 2019-12-19 MED ORDER — THROMBIN 5000 UNITS EX SOLR: OROMUCOSAL | Status: DC | PRN

## 2019-12-19 MED ORDER — BACITRACIN ZINC 500 UNIT/GM EX OINT
TOPICAL_OINTMENT | CUTANEOUS | Status: DC | PRN
  Administered 2019-12-19: 1 via TOPICAL

## 2019-12-19 MED ORDER — PROPOFOL 10 MG/ML IV BOLUS
INTRAVENOUS | Status: AC
  Filled 2019-12-19: qty 40

## 2019-12-19 MED ORDER — SODIUM CHLORIDE FLUSH 0.9 % IV SOLN
INTRAVENOUS | Status: DC | PRN
  Administered 2019-12-19: 70 mL
  Administered 2019-12-19: 100 mL

## 2019-12-19 MED ORDER — CEFAZOLIN SODIUM-DEXTROSE 2-4 GM/100ML-% IV SOLN
2.0000 g | Freq: Three times a day (TID) | INTRAVENOUS | Status: AC
  Administered 2019-12-19 – 2019-12-20 (×2): 2 g via INTRAVENOUS
  Filled 2019-12-19 (×2): qty 100

## 2019-12-19 MED ORDER — SODIUM CHLORIDE 0.9 % IV SOLN: INTRAVENOUS | Status: DC | PRN

## 2019-12-19 MED ORDER — FLUOXETINE HCL 10 MG PO CAPS
10.0000 mg | ORAL_CAPSULE | Freq: Every day | ORAL | Status: DC
  Administered 2019-12-20 – 2019-12-22 (×3): 10 mg via ORAL
  Filled 2019-12-19 (×3): qty 1

## 2019-12-19 MED ORDER — PHENYLEPHRINE HCL-NACL 10-0.9 MG/250ML-% IV SOLN: INTRAVENOUS | Status: DC | PRN

## 2019-12-19 MED ORDER — SENNOSIDES-DOCUSATE SODIUM 8.6-50 MG PO TABS
1.0000 | ORAL_TABLET | Freq: Every day | ORAL | Status: DC
  Administered 2019-12-20: 1 via ORAL
  Filled 2019-12-19: qty 1

## 2019-12-19 MED ORDER — ROCURONIUM BROMIDE 10 MG/ML (PF) SYRINGE
PREFILLED_SYRINGE | INTRAVENOUS | Status: DC | PRN
  Administered 2019-12-19: 40 mg via INTRAVENOUS
  Administered 2019-12-19: 70 mg via INTRAVENOUS
  Administered 2019-12-19 (×2): 30 mg via INTRAVENOUS
  Administered 2019-12-19 (×2): 50 mg via INTRAVENOUS
  Administered 2019-12-19: 30 mg via INTRAVENOUS

## 2019-12-19 MED ORDER — ACETAMINOPHEN 160 MG/5ML PO SOLN: 1000.0000 mg | Freq: Four times a day (QID) | ORAL | Status: DC

## 2019-12-19 MED ORDER — HYDROMORPHONE HCL 1 MG/ML IJ SOLN: 0.2500 mg | INTRAMUSCULAR | Status: DC | PRN

## 2019-12-19 MED ORDER — 0.9 % SODIUM CHLORIDE (POUR BTL) OPTIME
TOPICAL | Status: DC | PRN
  Administered 2019-12-19: 1000 mL

## 2019-12-19 SURGICAL SUPPLY — 161 items
ADAPTER VALVE BIOPSY EBUS (MISCELLANEOUS) IMPLANT
ADPTR VALVE BIOPSY EBUS (MISCELLANEOUS)
APPLICATOR TIP COSEAL (VASCULAR PRODUCTS) IMPLANT
APPLICATOR TIP EXT COSEAL (VASCULAR PRODUCTS) IMPLANT
BAG DECANTER FOR FLEXI CONT (MISCELLANEOUS) ×3 IMPLANT
BAND RUBBER #18 3X1/16 STRL (MISCELLANEOUS) ×4 IMPLANT
BENZOIN TINCTURE PRP APPL 2/3 (GAUZE/BANDAGES/DRESSINGS) IMPLANT
BLADE CLIPPER SURG (BLADE) ×3 IMPLANT
BLADE SURG 11 STRL SS (BLADE) ×3 IMPLANT
BLADE ULTRA TIP 2M (BLADE) IMPLANT
BRUSH CYTOL CELLEBRITY 1.5X140 (MISCELLANEOUS) IMPLANT
BUR ACRON 5.0MM COATED (BURR) ×2 IMPLANT
BUR MATCHSTICK NEURO 3.0 LAGG (BURR) ×2 IMPLANT
CANISTER SUCT 3000ML PPV (MISCELLANEOUS) ×10 IMPLANT
CARTRIDGE OIL MAESTRO DRILL (MISCELLANEOUS) ×3 IMPLANT
CATH THORACIC 28FR (CATHETERS) IMPLANT
CATH THORACIC 36FR (CATHETERS) IMPLANT
CATH THORACIC 36FR RT ANG (CATHETERS) IMPLANT
CLIP VESOCCLUDE MED 6/CT (CLIP) ×5 IMPLANT
CLOSURE WOUND 1/4X4 (GAUZE/BANDAGES/DRESSINGS)
CNTNR URN SCR LID CUP LEK RST (MISCELLANEOUS) ×6 IMPLANT
CONN ST 1/4X3/8  BEN (MISCELLANEOUS) ×5
CONN ST 1/4X3/8 BEN (MISCELLANEOUS) IMPLANT
CONT SPEC 4OZ STRL OR WHT (MISCELLANEOUS) ×10
COVER BACK TABLE 60X90IN (DRAPES) ×3 IMPLANT
COVER MAYO STAND STRL (DRAPES) IMPLANT
COVER WAND RF STERILE (DRAPES) ×3 IMPLANT
DEFOGGER SCOPE WARMER CLEARIFY (MISCELLANEOUS) ×5 IMPLANT
DERMABOND ADVANCED (GAUZE/BANDAGES/DRESSINGS) ×4
DERMABOND ADVANCED .7 DNX12 (GAUZE/BANDAGES/DRESSINGS) IMPLANT
DIFFUSER DRILL AIR PNEUMATIC (MISCELLANEOUS) ×5 IMPLANT
DISSECTOR BLUNT TIP ENDO 5MM (MISCELLANEOUS) ×2 IMPLANT
DRAIN CHANNEL 28F RND 3/8 FF (WOUND CARE) IMPLANT
DRAIN CHANNEL 32F RND 10.7 FF (WOUND CARE) IMPLANT
DRAPE LAPAROSCOPIC ABDOMINAL (DRAPES) ×5 IMPLANT
DRAPE LAPAROTOMY 100X72 PEDS (DRAPES) IMPLANT
DRAPE LAPAROTOMY 100X72X124 (DRAPES) ×2 IMPLANT
DRAPE MICROSCOPE LEICA (MISCELLANEOUS) ×2 IMPLANT
DRAPE UNIVERSAL PACK (DRAPES) ×2 IMPLANT
DRSG OPSITE POSTOP 3X4 (GAUZE/BANDAGES/DRESSINGS) ×2 IMPLANT
ELECT BLADE 4.0 EZ CLEAN MEGAD (MISCELLANEOUS) ×5
ELECT BLADE 6.5 EXT (BLADE) ×5 IMPLANT
ELECT REM PT RETURN 9FT ADLT (ELECTROSURGICAL) ×10
ELECTRODE BLDE 4.0 EZ CLN MEGD (MISCELLANEOUS) ×3 IMPLANT
ELECTRODE REM PT RTRN 9FT ADLT (ELECTROSURGICAL) ×6 IMPLANT
FILTER SMOKE EVAC ULPA (FILTER) ×3 IMPLANT
FORCEPS BIOP RJ4 1.8 (CUTTING FORCEPS) IMPLANT
GAUZE 4X4 16PLY RFD (DISPOSABLE) IMPLANT
GAUZE KITTNER 4X5 RF (MISCELLANEOUS) ×2 IMPLANT
GAUZE SPONGE 4X4 12PLY STRL (GAUZE/BANDAGES/DRESSINGS) ×5 IMPLANT
GLOVE BIO SURGEON STRL SZ 6 (GLOVE) ×4 IMPLANT
GLOVE BIO SURGEON STRL SZ 6.5 (GLOVE) ×14 IMPLANT
GLOVE BIO SURGEON STRL SZ7.5 (GLOVE) ×4 IMPLANT
GLOVE BIO SURGEONS STRL SZ 6.5 (GLOVE) ×8
GLOVE BIOGEL PI IND STRL 7.5 (GLOVE) ×6 IMPLANT
GLOVE BIOGEL PI INDICATOR 7.5 (GLOVE) ×10
GLOVE ECLIPSE 7.0 STRL STRAW (GLOVE) ×5 IMPLANT
GLOVE EXAM NITRILE XL STR (GLOVE) IMPLANT
GLOVE SURG SS PI 7.0 STRL IVOR (GLOVE) ×8 IMPLANT
GOWN STRL REUS W/ TWL LRG LVL3 (GOWN DISPOSABLE) ×12 IMPLANT
GOWN STRL REUS W/ TWL XL LVL3 (GOWN DISPOSABLE) IMPLANT
GOWN STRL REUS W/TWL 2XL LVL3 (GOWN DISPOSABLE) IMPLANT
GOWN STRL REUS W/TWL LRG LVL3 (GOWN DISPOSABLE) ×40
GOWN STRL REUS W/TWL XL LVL3 (GOWN DISPOSABLE) ×10
HEMOSTAT POWDER KIT SURGIFOAM (HEMOSTASIS) ×5 IMPLANT
HEMOSTAT SURGICEL 2X14 (HEMOSTASIS) IMPLANT
KIT BASIN OR (CUSTOM PROCEDURE TRAY) ×10 IMPLANT
KIT CLEAN ENDO COMPLIANCE (KITS) ×5 IMPLANT
KIT SUCTION CATH 14FR (SUCTIONS) ×5 IMPLANT
KIT TURNOVER KIT B (KITS) ×10 IMPLANT
L-HOOK LAP DISP 36CM (ELECTROSURGICAL) ×5
LHOOK LAP DISP 36CM (ELECTROSURGICAL) IMPLANT
MARKER SKIN DUAL TIP RULER LAB (MISCELLANEOUS) IMPLANT
NDL SPNL 18GX3.5 QUINCKE PK (NEEDLE) ×9 IMPLANT
NEEDLE HYPO 22GX1.5 SAFETY (NEEDLE) ×5 IMPLANT
NEEDLE SPNL 18GX3.5 QUINCKE PK (NEEDLE) ×15 IMPLANT
NS IRRIG 1000ML POUR BTL (IV SOLUTION) ×15 IMPLANT
OIL CARTRIDGE MAESTRO DRILL (MISCELLANEOUS) ×5
OIL SILICONE PENTAX (PARTS (SERVICE/REPAIRS)) ×5 IMPLANT
PACK CHEST (CUSTOM PROCEDURE TRAY) ×5 IMPLANT
PACK LAMINECTOMY NEURO (CUSTOM PROCEDURE TRAY) ×5 IMPLANT
PAD ARMBOARD 7.5X6 YLW CONV (MISCELLANEOUS) ×30 IMPLANT
PASSER SUT SWANSON 36MM LOOP (INSTRUMENTS) IMPLANT
PATTIES SURGICAL .25X.25 (GAUZE/BANDAGES/DRESSINGS) IMPLANT
PATTIES SURGICAL .5 X3 (DISPOSABLE) ×3 IMPLANT
PATTIES SURGICAL 1/4 X 3 (GAUZE/BANDAGES/DRESSINGS) ×3 IMPLANT
PENCIL SMOKE EVACUATOR (MISCELLANEOUS) ×3 IMPLANT
POUCH ENDO CATCH II 15MM (MISCELLANEOUS) IMPLANT
POUCH RETRIEVAL ECOSAC 10 (ENDOMECHANICALS) IMPLANT
POUCH RETRIEVAL ECOSAC 10MM (ENDOMECHANICALS) ×5
SCISSORS LAP 5X35 DISP (ENDOMECHANICALS) IMPLANT
SEALANT SURG COSEAL 4ML (VASCULAR PRODUCTS) IMPLANT
SEALANT SURG COSEAL 8ML (VASCULAR PRODUCTS) IMPLANT
SEALER LIGASURE MARYLAND 30 (ELECTROSURGICAL) ×9 IMPLANT
SET IRRIG TUBING LAPAROSCOPIC (IRRIGATION / IRRIGATOR) ×2 IMPLANT
SLEEVE SUCTION 125 (MISCELLANEOUS) ×3 IMPLANT
SOL ANTI FOG 6CC (MISCELLANEOUS) ×3 IMPLANT
SOLUTION ANTI FOG 6CC (MISCELLANEOUS) ×2
SPECIMEN JAR SMALL (MISCELLANEOUS) IMPLANT
SPONGE LAP 4X18 RFD (DISPOSABLE) IMPLANT
SPONGE NEURO XRAY DETECT 1X3 (DISPOSABLE) IMPLANT
SPONGE SURGIFOAM ABS GEL 100 (HEMOSTASIS) ×3 IMPLANT
SPONGE TONSIL TAPE 1 RFD (DISPOSABLE) IMPLANT
STAPLER ECHELON POWERED (MISCELLANEOUS) IMPLANT
STOPCOCK 3 WAY HIGH PRESSURE (MISCELLANEOUS) ×10
STOPCOCK 3WAY HIGH PRESSURE (MISCELLANEOUS) IMPLANT
STOPCOCK 4 WAY LG BORE MALE ST (IV SETS) ×5 IMPLANT
STRIP CLOSURE SKIN 1/4X4 (GAUZE/BANDAGES/DRESSINGS) IMPLANT
SUT PROLENE 3 0 SH DA (SUTURE) IMPLANT
SUT PROLENE 4 0 RB 1 (SUTURE)
SUT PROLENE 4-0 RB1 .5 CRCL 36 (SUTURE) IMPLANT
SUT PROLENE 6 0 BV (SUTURE) IMPLANT
SUT SILK  1 MH (SUTURE) ×20
SUT SILK 1 MH (SUTURE) ×12 IMPLANT
SUT SILK 1 TIES 10X30 (SUTURE) IMPLANT
SUT SILK 2 0 SH (SUTURE) IMPLANT
SUT SILK 2 0 TIES 10X30 (SUTURE) ×2 IMPLANT
SUT SILK 2 0SH CR/8 30 (SUTURE) IMPLANT
SUT STEEL 1 (SUTURE) IMPLANT
SUT VIC AB 0 CT1 18XCR BRD8 (SUTURE) ×3 IMPLANT
SUT VIC AB 0 CT1 8-18 (SUTURE) ×10
SUT VIC AB 0 CTX 18 (SUTURE) ×7 IMPLANT
SUT VIC AB 1 CTX 18 (SUTURE) ×2 IMPLANT
SUT VIC AB 1 CTX 36 (SUTURE)
SUT VIC AB 1 CTX36XBRD ANBCTR (SUTURE) IMPLANT
SUT VIC AB 2-0 CP2 18 (SUTURE) ×3 IMPLANT
SUT VIC AB 2-0 CTX 36 (SUTURE) IMPLANT
SUT VIC AB 3-0 SH 8-18 (SUTURE) IMPLANT
SUT VIC AB 3-0 X1 27 (SUTURE) ×2 IMPLANT
SUT VICRYL 0 UR6 27IN ABS (SUTURE) IMPLANT
SUT VICRYL 2 TP 1 (SUTURE) IMPLANT
SUT VICRYL 3-0 RB1 18 ABS (SUTURE) ×2 IMPLANT
SYR 10ML LL (SYRINGE) ×2 IMPLANT
SYR 20ML ECCENTRIC (SYRINGE) ×5 IMPLANT
SYR 50ML LL SCALE MARK (SYRINGE) ×5 IMPLANT
SYR 5ML LL (SYRINGE) ×5 IMPLANT
SYR BULB IRRIG 60ML STRL (SYRINGE) ×2 IMPLANT
SYSTEM RETRIEVAL ANCHOR 12 (MISCELLANEOUS) ×2 IMPLANT
SYSTEM RETRIEVAL ANCHOR 8 (MISCELLANEOUS) ×2 IMPLANT
SYSTEM SAHARA CHEST DRAIN ATS (WOUND CARE) ×5 IMPLANT
TAPE CLOTH SURG 4X10 WHT LF (GAUZE/BANDAGES/DRESSINGS) ×2 IMPLANT
TAPE UMBILICAL COTTON 1/8X30 (MISCELLANEOUS) IMPLANT
TOWEL GREEN STERILE (TOWEL DISPOSABLE) ×10 IMPLANT
TOWEL GREEN STERILE FF (TOWEL DISPOSABLE) ×10 IMPLANT
TRAP FLUID SMOKE EVACUATOR (MISCELLANEOUS) ×3 IMPLANT
TRAP SPECIMEN MUCUS 40CC (MISCELLANEOUS) ×2 IMPLANT
TRAY FOLEY MTR SLVR 16FR STAT (SET/KITS/TRAYS/PACK) ×5 IMPLANT
TROCAR FLEXIPATH 20X80 (ENDOMECHANICALS) IMPLANT
TROCAR FLEXIPATH THORACIC 15MM (ENDOMECHANICALS) IMPLANT
TROCAR XCEL 12X100 BLDLESS (ENDOMECHANICALS) IMPLANT
TROCAR XCEL BLADELESS 5X75MML (TROCAR) ×7 IMPLANT
TROCAR XCEL NON-BLD 11X100MML (ENDOMECHANICALS) ×9 IMPLANT
TROCAR XCEL NON-BLD 5MMX100MML (ENDOMECHANICALS) ×5 IMPLANT
TUBE CONNECTING 20'X1/4 (TUBING) ×2
TUBE CONNECTING 20X1/4 (TUBING) ×5 IMPLANT
TUBING EXTENTION W/L.L. (IV SETS) ×11 IMPLANT
TUBING LAP HI FLOW INSUFFLATIO (TUBING) ×3 IMPLANT
VALVE BIOPSY  SINGLE USE (MISCELLANEOUS) ×5
VALVE BIOPSY SINGLE USE (MISCELLANEOUS) ×3 IMPLANT
VALVE SUCTION BRONCHIO DISP (MISCELLANEOUS) ×5 IMPLANT
WATER STERILE IRR 1000ML POUR (IV SOLUTION) ×10 IMPLANT

## 2019-12-19 NOTE — Anesthesia Procedure Notes (Signed)
Procedure Name: Intubation Date/Time: 12/19/2019 7:47 AM Performed by: Rande Brunt, CRNA Pre-anesthesia Checklist: Patient identified, Emergency Drugs available, Suction available and Patient being monitored Patient Re-evaluated:Patient Re-evaluated prior to induction Oxygen Delivery Method: Circle System Utilized Preoxygenation: Pre-oxygenation with 100% oxygen Induction Type: IV induction Ventilation: Mask ventilation without difficulty Laryngoscope Size: Mac and 4 Grade View: Grade II Tube type: Oral Tube size: 8.5 mm Number of attempts: 1 Airway Equipment and Method: Stylet Placement Confirmation: ETT inserted through vocal cords under direct vision,  positive ETCO2 and breath sounds checked- equal and bilateral Tube secured with: Tape Dental Injury: Teeth and Oropharynx as per pre-operative assessment

## 2019-12-19 NOTE — Hospital Course (Signed)
HPI: Brittany Morgan is a 49 y.o. female is seen in the office for evaluation of a left upper chest posterior mediastinal  cystic lesion.  Evaluation started when the patient went to the emergency room "feeling " bad with elevated blood pressure.  A CT scan of the chest was done while she was in the emergency room that showed a large left upper chest cystic structure, further evaluation with PET scan and MRI of the chest has been done.   The patient has no previous history of smoking.  She denies any complaint of pain in the left chest, has no neurologic complaints in the lower extremities or paresthesias on the left chest she has had some mild shortness of breath with exertion.   She has 3 children the middle child now at age 71 had a neuroblastoma 2 years, treated with adrenalectomy, whole body radiation and bone marrow transplant.  Subsequently developed breast cancer.    Since last seen the patient has been for neurosurgical evaluation with Dr. Kathyrn Sheriff. The patient has a heterogeneous left upper chest cystic structure probably arising from the T2 neuroforamen-possibly schwannoma, associated enhancing nodule in the left paraspinous muscle-. Dr. Servando Snare reviewed the CT PET and MRI findings with the patient and her husband and  mother.  With the size of the mass, he recommended that we proceed with surgical resection.  With the involvement of the T2 neuroforamen, Dr Kathyrn Sheriff from neurosurgery has reviewed the films and seen the patient and discussed with her the surgical approach first to intraforaminal mass to the left T2.  Dr. Servando Snare has discussed with him proceeding with left video-assisted thoracoscopy possible thoracotomy with resection of the mass.

## 2019-12-19 NOTE — Transfer of Care (Signed)
Immediate Anesthesia Transfer of Care Note  Patient: Brittany Morgan  Procedure(s) Performed: THORACIC LAMINECTOMY FOR TUMOR, LEFT THORACIC TWO- THORACIC THREE (Left Neck) VIDEO BRONCHOSCOPY (N/A ) VIDEO ASSISTED THORACOSCOPY (VATS)/RESECTION POSTERIOR MEDIASTINAL MASS WITH INTERCOSTAL NERVE BLOCK (Left Chest)  Patient Location: PACU  Anesthesia Type:General  Level of Consciousness: awake and drowsy  Airway & Oxygen Therapy: Patient Spontanous Breathing and Patient connected to face mask oxygen  Post-op Assessment: Report given to RN, Post -op Vital signs reviewed and stable and Patient moving all extremities X 4  Post vital signs: Reviewed and stable  Last Vitals:  Vitals Value Taken Time  BP 134/80 12/19/19 1658  Temp    Pulse 84 12/19/19 1705  Resp 17 12/19/19 1705  SpO2 100 % 12/19/19 1705  Vitals shown include unvalidated device data.  Last Pain:  Vitals:   12/19/19 0616  TempSrc:   PainSc: 0-No pain         Complications: No complications documented.

## 2019-12-19 NOTE — H&P (Signed)
[address]       [name] [address]             [phone]                    Brittany Morgan Medical Record #[id number] Date of Birth: [birth year] Referring: Freddi Starr, MD Primary Care: Patient, No Pcp Per Primary Cardiologist: No primary care provider on file.  Chief Complaint:    Chief Complaint  Patient presents with  . Lung Mass    further discuss scheduling surgery, after seeing Dr Marlene Bast    History of Present Illness:    Brittany Morgan 49 y.o. female is seen in the office   for evaluation of a left upper chest posterior mediastinal  cystic lesion.  Evaluation started when the patient went to the emergency room "feeling " bad with elevated blood pressure.  A CT scan of the chest was done while she was in the emergency room that showed a large left upper chest cystic structure, further evaluation with PET scan and MRI of the chest has been done.  The patient has no previous history of smoking.  She denies any complaint of pain in the left chest, has no neurologic complaints in the lower extremities or paresthesias on the left chest she has had some mild shortness of breath with exertion.  She has 3 children the middle child now at age 20 had a neuroblastoma 2 years, treated with adrenalectomy, whole body radiation and bone marrow transplant.  Subsequently developed breast cancer.    Since last seen the patient has been for neurosurgical evaluation with Dr. Kathyrn Sheriff.   Current Activity/ Functional Status:  Patient is independent with mobility/ambulation, transfers, ADL's, IADL's.   Zubrod Score: At the time of surgery this patient's most appropriate activity status/level should be described as: [x]     0    Normal activity, no symptoms []     1    Restricted in physical strenuous activity but ambulatory, able to do out light work []     2    Ambulatory and capable of self care, unable to do work activities, up and  about               >50 % of waking hours                              []     3    Only limited self care, in bed greater than 50% of waking hours []     4    Completely disabled, no self care, confined to bed or chair []     5    Moribund   Past Medical History:  Diagnosis Date  . Anxiety   . Dyspnea    on exertion  . Hypertension     Past Surgical History:  Procedure Laterality Date  . APPENDECTOMY      Family History  Problem Relation Age of Onset  . Hyperlipidemia Mother   . Heart disease Mother        AMI; s/p stenting  . Stroke Father 76  . Hypertension Father      Social History   Tobacco Use  Smoking Status Passive Smoke Exposure - Never Smoker  Smokeless Tobacco Never Used  Tobacco Comment   mother smokes- is frequently around mother.     Social History   Substance and  Sexual Activity  Alcohol Use No  . Alcohol/week: 0.0 standard drinks     No Known Allergies  Current Facility-Administered Medications  Medication Dose Route Frequency Provider Last Rate Last Admin  . ceFAZolin (ANCEF) IVPB 2g/100 mL premix  2 g Intravenous 30 min Pre-Op Grace Isaac, MD      . chlorhexidine (PERIDEX) 0.12 % solution           . Chlorhexidine Gluconate Cloth 2 % PADS 6 each  6 each Topical Once [name], MD       And  . Chlorhexidine Gluconate Cloth 2 % PADS 6 each  6 each Topical Once [name], MD        Pertinent items are noted in HPI.   Review of Systems:     Cardiac Review of Systems: [Y] = yes  or   [ N ] = no   Chest Pain [ n   ]  Resting SOB [  n ] Exertional SOB  [ mild ]  Orthopnea [n ]   Pedal Edema [ n  ]    Palpitations [ n ] Syncope  [n  ]   Presyncope [ n  ]   General Review of Systems: [Y] = yes [  ]=no Constitional: recent weight change [  ];  Wt loss over the last 3 months [   ] anorexia [  ]; fatigue [  ]; nausea [  ]; night sweats [  ]; fever [  ]; or chills [  ];           Eye : blurred vision [  ]; diplopia [   ];  vision changes [  ];  Amaurosis fugax[  ]; Resp: cough [  ];  wheezing[  ];  hemoptysis[  ]; shortness of breath[  ]; paroxysmal nocturnal dyspnea[  ]; dyspnea on exertion[  ]; or orthopnea[  ];  GI:  gallstones[  ], vomiting[  ];  dysphagia[  ]; melena[  ];  hematochezia [  ]; heartburn[  ];   Hx of  Colonoscopy[  ]; GU: kidney stones [  ]; hematuria[  ];   dysuria [  ];  nocturia[  ];  history of     obstruction [  ]; urinary frequency [  ]             Skin: rash, swelling[  ];, hair loss[  ];  peripheral edema[  ];  or itching[  ]; Musculosketetal: myalgias[  ];  joint swelling[  ];  joint erythema[  ];  joint pain[  ];  back pain[  ];  Heme/Lymph: bruising[  ];  bleeding[  ];  anemia[  ];  Neuro: TIA[  ];  headaches[  ];  stroke[  ];  vertigo[  ];  seizures[  ];   paresthesias[  ];  difficulty walking[  ];  Psych:depression[  ]; anxiety[  ];  Endocrine: diabetes[  ];  thyroid dysfunction[  ];  Immunizations: Flu up to date [ ? ]; Pneumococcal up to date [ ? ]; COVID-35 vacination completed  [ y ]  Other:     PHYSICAL EXAMINATION: BP (!) 159/105   Pulse 90   Temp 98.4 F (36.9 C) (Oral)   Resp 20   Ht 5\' 6"  (1.676 m)   Wt 97.5 kg   SpO2 100%   BMI 34.69 kg/m  General appearance: alert, cooperative and no distress Head: Normocephalic, without obvious abnormality, atraumatic Lymph nodes: Cervical, supraclavicular,  and axillary nodes normal. Resp: clear to auscultation bilaterally Cardio: regular rate and rhythm, S1, S2 normal, no murmur, click, rub or gallop GI: soft, non-tender; bowel sounds normal; no masses,  no organomegaly Extremities: extremities normal, atraumatic, no cyanosis or edema Neurologic: Grossly normal  Diagnostic Studies & Laboratory data:     Recent Radiology Findings:   MR THORACIC SPINE W WO CONTRAST  Result Date: 11/18/2019 CLINICAL DATA:  Paraspinal mass EXAM: MRI THORACIC WITHOUT AND WITH CONTRAST TECHNIQUE: Multiplanar and multiecho pulse sequences  of the thoracic spine were obtained without and with intravenous contrast. CONTRAST:  94mL GADAVIST GADOBUTROL 1 MMOL/ML IV SOLN COMPARISON:  CTA chest 10/29/2019 FINDINGS: MRI THORACIC SPINE FINDINGS Alignment:  Physiologic. Vertebrae: No fracture, evidence of discitis, or bone lesion. Cord:  Normal signal and morphology. Paraspinal and other soft tissues: There is a predominantly cystic mass filling the upper half the left hemithorax. There is a component of the mass arising from the left T2 neural foramen. The most medial intraforaminal component of the mass shows heterogeneous moderate contrast enhancement. In the left posterior paraspinous muscles, there is a heterogeneously contrast-enhancing nodule that measures 1.3 x 1.7 x 2.9 cm. Disc levels: There is no spinal canal stenosis. Aside from the left T2 neural foramen, there is no stenosis. At T11-12 there is a small left subarticular disc protrusion that mildly narrows the ventral thecal sac IMPRESSION: 1. Predominantly cystic mass filling the upper half of the left hemithorax and likely arising from the left T2 neural foramen. 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 Venolymphatic malformation is less likely due to the foraminal component. 2. Heterogeneously enhancing nodule within the left paraspinal muscles. This is likely an extension of the same process as there is a taillike component extending toward the left T2 neural foramen. 3. No spinal canal stenosis. Electronically Signed   By: Ulyses Jarred M.D.   On: 11/18/2019 23:18   NM PET Image Initial (PI) Skull Base To Thigh  Result Date: 11/07/2019 CLINICAL DATA:  Initial treatment strategy for cystic left chest mass on CT. EXAM: NUCLEAR MEDICINE PET SKULL BASE TO THIGH TECHNIQUE: 10.8 mCi F-18 FDG was injected intravenously. Full-ring PET imaging was performed from the skull base to thigh after the radiotracer. CT data was obtained and used for attenuation  correction and anatomic localization. Fasting blood glucose: 100 mg/dl COMPARISON:  Chest CT 10/29/2019 FINDINGS: Mediastinal blood pool activity: SUV max 2.9 Liver activity: SUV max NA NECK: No areas of abnormal hypermetabolism. Incidental CT findings: No cervical adenopathy. CHEST: Similar size of a left chest minimally complex cystic lesion, favored to arise from the mediastinum. Example 8.5 x 7.9 cm on 57/4. Some areas within this lesion demonstrate low-level hypermetabolism, including at a S.U.V. max of 3.5. A low periesophageal node measures 6 mm and a S.U.V. max of 2.9 on 90/4. Incidental CT findings: Deferred to recent diagnostic CT. The right breast lesions described on that exam are not significantly hypermetabolic. The areas of infectious ill-defined ground-glass nodularity are improved, given differences in technique. ABDOMEN/PELVIS: No abdominopelvic parenchymal or nodal hypermetabolism. Incidental CT findings: Normal adrenal glands. Colonic stool burden suggests constipation. Appendectomy. Globular uterus, suggesting underlying fibroids. SKELETON: No abnormal marrow activity. Incidental CT findings: none IMPRESSION: 1. Low-level hypermetabolism corresponding to portions of the cystic mass within the left hemithorax, favored to arise from the mediastinum. Differential considerations include bronchogenic cyst, GI duplication cyst, thymic cyst, or lymphangioma. Consider sampling/aspiration. If this is not performed, consider follow-up with pre  and post-contrast MRI at 3 months to confirm size stability and further evaluate morphology. 2. Mild hypermetabolism corresponding to a low periesophageal node, favored to be reactive. 3. Incidental findings, including: Fibroid uterus. Probable constipation. Electronically Signed   By: Abigail Miyamoto M.D.   On: 11/07/2019 09:21     I have independently reviewed the above radiology studies  and reviewed the findings with the patient.   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  With the size of the mass I recommended that we proceed with surgical resection.  With the involvement of the T2 neuroforamen  Dr Marlene Bast has reviewed the films and seen the patient and discussed with her the surgical approach first to intraforaminal mass to the left T2.  I have discussed with him proceeding with left video-assisted thoracoscopy possible thoracotomy with resection of the mass.   The goals risks and alternatives of the planned surgical procedure Procedure(s): THORACIC LAMINECTOMY FOR TUMOR, LEFT THORACIC 2- THORACIC 3 (Left) VIDEO BRONCHOSCOPY (N/A) VIDEO ASSISTED THORACOSCOPY (VATS)/RESECTION POSTERIOR MEDIASTINAL MASS (Left)  have been discussed with the patient in detail. The risks of the procedure including death, infection, stroke, myocardial infarction, bleeding, blood transfusion have all been discussed specifically.  I have quoted [name] a 1 % of perioperative mortality and a complication rate as high as 30 %. The patient's questions have been answered.Brittany Morgan is willing  to proceed with the planned procedure.  Grace Isaac MD      [address] [place] [address] Office [phone]     12/19/2019 6:48 AM

## 2019-12-19 NOTE — Anesthesia Procedure Notes (Signed)
Procedure Name: Intubation Date/Time: 12/19/2019 11:49 AM Performed by: Rande Brunt, CRNA Pre-anesthesia Checklist: Patient identified, Emergency Drugs available, Suction available and Patient being monitored Patient Re-evaluated:Patient Re-evaluated prior to induction Oxygen Delivery Method: Circle System Utilized Preoxygenation: Pre-oxygenation with 100% oxygen Induction Type: IV induction Ventilation: Mask ventilation without difficulty Laryngoscope Size: Glidescope and 4 Grade View: Grade II Tube type: Oral Endobronchial tube: EBT position confirmed by fiberoptic bronchoscope, EBT position confirmed by auscultation, Double lumen EBT and Left and 37 Fr Number of attempts: 1 Airway Equipment and Method: Stylet and Oral airway Placement Confirmation: ETT inserted through vocal cords under direct vision,  positive ETCO2 and breath sounds checked- equal and bilateral Tube secured with: Tape Dental Injury: Teeth and Oropharynx as per pre-operative assessment  Difficulty Due To: Difficulty was unanticipated

## 2019-12-19 NOTE — Anesthesia Procedure Notes (Signed)
Central Venous Catheter Insertion Performed by: Roderic Palau, MD, anesthesiologist Start/End10/07/2019 6:55 AM, 12/19/2019 7:05 AM Patient location: Pre-op. Preanesthetic checklist: patient identified, IV checked, site marked, risks and benefits discussed, surgical consent, monitors and equipment checked, pre-op evaluation, timeout performed and anesthesia consent Position: Trendelenburg Lidocaine 1% used for infiltration and patient sedated Hand hygiene performed , maximum sterile barriers used  and Seldinger technique used Catheter size: 8 Fr Total catheter length 16. Central line was placed.Double lumen Procedure performed using ultrasound guided technique. Ultrasound Notes:anatomy identified, needle tip was noted to be adjacent to the nerve/plexus identified, no ultrasound evidence of intravascular and/or intraneural injection and image(s) printed for medical record Attempts: 1 Following insertion, dressing applied, line sutured and Biopatch. Post procedure assessment: blood return through all ports  Patient tolerated the procedure well with no immediate complications.

## 2019-12-19 NOTE — Brief Op Note (Addendum)
      [address]       [place] [address]             [phone]    12/19/2019  6:17 PM  PATIENT:  Brittany Morgan  49 y.o. 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 Lars Pinks PA-C 2. Wayne Gold PA-C  ANESTHESIA:   general  EBL:  250 mL  Total - both cases   BLOOD ADMINISTERED:none  DRAINS: Chest tubes placed in the left pleural space   LOCAL MEDICATIONS USED:  OTHER Exparel  SPECIMEN:  Source of Specimen:  Chest mass. Frozen section showed spindle cell proliferation.  DISPOSITION OF SPECIMEN:  PATHOLOGY  COUNTS CORRECT:  YES  DICTATION: .Dragon Dictation  PLAN OF CARE: Admit to inpatient   PATIENT DISPOSITION:  PACU - hemodynamically stable.   Delay start of Pharmacological VTE agent (>24hrs) due to surgical blood loss or risk of bleeding: yes

## 2019-12-19 NOTE — Op Note (Signed)
NEUROSURGERY OPERATIVE NOTE   PREOP DIAGNOSIS:  1. Thoracic tumor   POSTOP DIAGNOSIS: Same  PROCEDURE: 1. Left T2 laminectomy with facetectomy, transpedicular approach for resection of tumor 2. Resection of intramuscular thoracic tumor 3. Use of microscope for microdissection   SURGEON: Dr. [name], MD  ASSISTANT: Ferne Reus, PA-C  ANESTHESIA: General Endotracheal  EBL: 100cc  SPECIMENS:  1. Left T2 tumor 2. Intramuscular tumor  DRAINS: None  COMPLICATIONS: None immediate  CONDITION: Hemodynamically stable for stage 2 of procedure  HISTORY: Brittany Morgan is a 49 y.o. female initially seen in the outpatient neurosurgery clinic after previous visit to the emergency department revealed a left-sided chest mass.  Mass was quite large requiring resection, however it was noted to extend into the left T2 foramen and abuts the thecal sac.  After review of the images, I did speak with the patient about thoracic laminectomy for resection of the portion of the tumor within the spinal canal/foramen.  Risks, benefits, and expected postoperative course from a neurosurgical standpoint were all discussed in detail with the patient.  After all her questions were answered informed consent was obtained and witnessed. I did speak with Dr. Servando Snare, cardiothoracic surgery and we elected to proceed with a staged procedure to resect the spinal portion of the tumor first, followed by thoracotomy for resection of the chest portion of the mass.  PROCEDURE IN DETAIL: The patient was brought to the operating room. After induction of general anesthesia, the patient was positioned on the operative table in the prone position in the [place] head holder. All pressure points were meticulously padded. Skin incision was then marked out and prepped and draped in the usual sterile fashion.  After timeout was conducted, spinal needle was introduced subcutaneously and intraoperative AP fluoroscopy  allowed identification of the T2 vertebral body.  The skin incision was then infiltrated with local anesthetic with epinephrine.  Incision was then made sharply and carried down through the subcutaneous tissue until the thoracodorsal fascia was identified and incised.  Subperiosteal dissection was carried out along the left spinous process and hemilamina of T2.  Self-retaining retractor was placed and further intraoperative fluoroscopic images confirmed our location at the correct level.  Lateral dissection was continued until the transverse process of T2 and T3 were identified.    High-speed drill was then used to complete left hemilaminotomy.  Thecal sac was then identified.  High-speed drill was then used to cut across the pars interarticularis on the left side at T2.  This allowed removal of the inferior articulating process of T2.  I then remove the superior articulating process of T3 with a high-speed drill and Kerrison punches.  The foramen appeared to be filled with relatively firm, mobile tumor which was contiguous with the left T2 nerve root suggesting schwannoma.  In order to limit any traction on the thecal sac I elected to remove the left T2 pedicle in order to fully mobilize and resect the foraminal portion of this tumor.  High-speed drill was used to drill down the pedicle and rongeurs were used to remove the medial and inferior walls of the pedicle.  This allowed placement of a right angle clamp underneath the left T2 nerve root and tumor.  The nerve root was then tied off with 2-0 silk stitch at its exit from the dura.  The nerve root was then coagulated and ligated.  The portion of the nerve root contiguous with the thecal sac and nerve root sleeve was coagulated.  The  tumor was then further dissected laterally for a length of approximately 1-1/2 centimeters, lateral to the lateral edge of the vertebral body.  At this point, the tumor was again coagulated and truncated.  The foraminal portion of  the tumor was then removed and sent for permanent pathology.  Hemostasis was then secured within the foramen in the epidural space using a combination of bipolar electrocautery and morselized Gelfoam with thrombin.  At this point, the fascia was identified and undermined with Metzenbaum scissors.  I was able to palpate a relatively firm, mobile mass corresponding to the intramuscular lesion seen on preoperative MRI.  This mass was circumferentially dissected using Metzenbaum scissors, and removed en bloc and sent as a separate specimen.  Hemostasis in the muscle was easily obtained with bipolar electrocautery.  At this point the wound was irrigated with a copious amount of normal saline irrigation.  The fascia was closed using interrupted 0 Vicryl stitches, subcutaneous layer was closed with interrupted 0 Vicryl stitches and the skin was closed with interrupted 3-0 Vicryl subcuticular stitch.  Dermabond was then applied, dried, and a sterile dressing was placed.  At the end of the case all sponge, needle, instrument, and cottonoid counts were correct.  Patient was then removed from the Mayfield head holder and was positioned appropriately for the second portion of the procedure by cardiothoracic surgery, details of which are dictated separately.

## 2019-12-19 NOTE — Anesthesia Procedure Notes (Signed)
Arterial Line Insertion Start/End10/07/2019 7:10 AM, 12/19/2019 7:15 AM Performed by: Janace Litten, CRNA, CRNA  Patient location: Pre-op. Preanesthetic checklist: patient identified, IV checked, site marked, risks and benefits discussed, surgical consent, monitors and equipment checked, pre-op evaluation, timeout performed and anesthesia consent Lidocaine 1% used for infiltration radial was placed Catheter size: 20 Fr Hand hygiene performed  and maximum sterile barriers used  Allen's test indicative of satisfactory collateral circulation Attempts: 3 (Attempted x 2 by Junie Bame, CRNA) Procedure performed without using ultrasound guided technique. Following insertion, dressing applied and Biopatch. Post procedure assessment: normal and unchanged  Patient tolerated the procedure well with no immediate complications.

## 2019-12-20 ENCOUNTER — Encounter (HOSPITAL_COMMUNITY): Payer: Self-pay | Admitting: Neurosurgery

## 2019-12-20 ENCOUNTER — Inpatient Hospital Stay (HOSPITAL_COMMUNITY): Payer: Self-pay

## 2019-12-20 LAB — BLOOD GAS, ARTERIAL
Acid-Base Excess: 0.7 mmol/L (ref 0.0–2.0)
Bicarbonate: 25.1 mmol/L (ref 20.0–28.0)
Drawn by: 44135
FIO2: 21
O2 Saturation: 95.8 %
Patient temperature: 37
pCO2 arterial: 42.7 mmHg (ref 32.0–48.0)
pH, Arterial: 7.387 (ref 7.350–7.450)
pO2, Arterial: 79.6 mmHg — ABNORMAL LOW (ref 83.0–108.0)

## 2019-12-20 LAB — CBC
HCT: 28.7 % — ABNORMAL LOW (ref 36.0–46.0)
Hemoglobin: 9.8 g/dL — ABNORMAL LOW (ref 12.0–15.0)
MCH: 30.4 pg (ref 26.0–34.0)
MCHC: 34.1 g/dL (ref 30.0–36.0)
MCV: 89.1 fL (ref 80.0–100.0)
Platelets: 207 10*3/uL (ref 150–400)
RBC: 3.22 MIL/uL — ABNORMAL LOW (ref 3.87–5.11)
RDW: 14.4 % (ref 11.5–15.5)
WBC: 10.6 10*3/uL — ABNORMAL HIGH (ref 4.0–10.5)
nRBC: 0 % (ref 0.0–0.2)

## 2019-12-20 LAB — BASIC METABOLIC PANEL
Anion gap: 9 (ref 5–15)
BUN: 11 mg/dL (ref 6–20)
CO2: 25 mmol/L (ref 22–32)
Calcium: 8.2 mg/dL — ABNORMAL LOW (ref 8.9–10.3)
Chloride: 109 mmol/L (ref 98–111)
Creatinine, Ser: 1.18 mg/dL — ABNORMAL HIGH (ref 0.44–1.00)
GFR calc non Af Amer: 53 mL/min — ABNORMAL LOW (ref >60)
Glucose, Bld: 113 mg/dL — ABNORMAL HIGH (ref 70–99)
Potassium: 3.7 mmol/L (ref 3.5–5.1)
Sodium: 143 mmol/L (ref 135–145)

## 2019-12-20 LAB — SURGICAL PATHOLOGY

## 2019-12-20 MED ORDER — ENOXAPARIN SODIUM 40 MG/0.4ML ~~LOC~~ SOLN
40.0000 mg | Freq: Every day | SUBCUTANEOUS | Status: DC
  Administered 2019-12-21: 40 mg via SUBCUTANEOUS
  Filled 2019-12-20 (×2): qty 0.4

## 2019-12-20 NOTE — Progress Notes (Signed)
Offered to help her sit in a chair , but claimed she is sleepy.

## 2019-12-20 NOTE — Progress Notes (Signed)
Patient ID: Brittany Morgan, female   DOB: April 17, 1970, 49 y.o.   MRN: [id number] TCTS DAILY ICU PROGRESS NOTE                   [address]            [place] [address]          [phone]   1 Day Post-Op Procedure(s) (LRB): THORACIC LAMINECTOMY FOR TUMOR, LEFT THORACIC TWO- THORACIC THREE (Left) VIDEO BRONCHOSCOPY (N/A) VIDEO ASSISTED THORACOSCOPY (VATS)/RESECTION POSTERIOR MEDIASTINAL MASS WITH INTERCOSTAL NERVE BLOCK (Left)  Total Length of Stay:  LOS: 1 day   Subjective: Patient awake alert neurologically intact, complains of some numbness over the left lower chest wall likely related to the nerve blocks with Exparel.  Good respiratory effort.  Voice is clear without evidence of recurrent nerve injury.  Objective: Vital signs in last 24 hours: Temp:  [97.1 F (36.2 C)-98.7 F (37.1 C)] 97.6 F (36.4 C) (10/06 0730) Pulse Rate:  [79-93] 85 (10/06 0730) Cardiac Rhythm: Normal sinus rhythm (10/05 1900) Resp:  [11-20] 20 (10/06 0730) BP: (128-146)/(79-99) 135/97 (10/06 0730) SpO2:  [92 %-100 %] 95 % (10/06 0730) Arterial Line BP: (149-188)/(72-86) 169/81 (10/06 0451)  Filed Weights   12/19/19 0553  Weight: 97.5 kg    Weight change:    Hemodynamic parameters for last 24 hours:    Intake/Output from previous day: 10/05 0701 - 10/06 0700 In: 1950.9 [I.V.:3493.2; IV Piggyback:200] Out: 3267 [Urine:1410; Blood:250; Chest Tube:194]  Intake/Output this shift: No intake/output data recorded.  Current Meds: Scheduled Meds: . acetaminophen  1,000 mg Oral Q6H   Or  . acetaminophen (TYLENOL) oral liquid 160 mg/5 mL  1,000 mg Oral Q6H  . bisacodyl  10 mg Oral Daily  . Chlorhexidine Gluconate Cloth  6 each Topical Daily  . enoxaparin (LOVENOX) injection  40 mg Subcutaneous Daily  . FLUoxetine  10 mg Oral Daily  . hydrochlorothiazide  12.5 mg Oral Daily  . ketorolac  15 mg Intravenous Q6H  . lisinopril  10 mg Oral Daily  . multivitamin with minerals  1  tablet Oral Daily  . senna-docusate  1 tablet Oral QHS   Continuous Infusions: . sodium chloride    . 0.9 % NaCl with KCl 20 mEq / L 100 mL/hr at 12/20/19 0516  .  ceFAZolin (ANCEF) IV Stopped (12/19/19 2356)   PRN Meds:.Place/Maintain arterial line **AND** sodium chloride, fentaNYL (SUBLIMAZE) injection, levalbuterol, ondansetron (ZOFRAN) IV, oxyCODONE, traMADol  General appearance: alert, cooperative and no distress Neurologic: intact Heart: regular rate and rhythm, S1, S2 normal, no murmur, click, rub or gallop Lungs: clear to auscultation bilaterally Abdomen: soft, non-tender; bowel sounds normal; no masses,  no organomegaly Extremities: extremities normal, atraumatic, no cyanosis or edema Wound: Chest tube in place, low amount of drainage no air leak  Lab Results: CBC: Recent Labs    12/19/19 1614 12/20/19 0430  WBC  --  10.6*  HGB 10.5* 9.8*  HCT 31.0* 28.7*  PLT  --  207   BMET:  Recent Labs    12/19/19 1614 12/20/19 0430  NA 141 143  K 3.8 3.7  CL 105 109  CO2  --  25  GLUCOSE 185* 113*  BUN 12 11  CREATININE 1.20* 1.18*  CALCIUM  --  8.2*    CMET: Lab Results  Component Value Date   WBC 10.6 (H) 12/20/2019   HGB 9.8 (L) 12/20/2019   HCT 28.7 (L) 12/20/2019  PLT 207 12/20/2019   GLUCOSE 113 (H) 12/20/2019   ALT 14 12/15/2019   AST 14 (L) 12/15/2019   NA 143 12/20/2019   K 3.7 12/20/2019   CL 109 12/20/2019   CREATININE 1.18 (H) 12/20/2019   BUN 11 12/20/2019   CO2 25 12/20/2019   INR 1.0 12/15/2019      PT/INR: No results for input(s): LABPROT, INR in the last 72 hours. Radiology: DG Thoracic Spine 2 View  Result Date: 12/19/2019 CLINICAL DATA:  T2-T3 laminectomy for tumor excision EXAM: THORACIC SPINE 2 VIEWS; DG C-ARM 1-60 MIN COMPARISON:  MR thoracic spine, 11/17/2019 FLUOROSCOPY TIME:  11 seconds FINDINGS: Intraoperative fluoroscopic views of the upper thoracic spine demonstrate retractors and marking instruments overlying the T2  vertebral body. Support apparatus including endotracheal tube and esophagogastric tube. IMPRESSION: Intraoperative fluoroscopic views demonstrate retractors and marking instruments overlying the T2 vertebral body. Electronically Signed   By: Eddie Candle M.D.   On: 12/19/2019 11:42   DG Chest Port 1 View  Result Date: 12/19/2019 CLINICAL DATA:  Postop EXAM: PORTABLE CHEST 1 VIEW COMPARISON:  12/15/2019, CT 10/29/2019, PET CT 11/07/2019 FINDINGS: Left-sided central venous catheter with tip projecting over the venous confluence. Insertion of left-sided chest tube with tip at the left apex. No visible left pneumothorax. Small amount of right fissural fluid. Enlarged cardiomediastinal silhouette. Resolved left apical mass with minimal residual opacity. Mild tracheal deviation to the right. IMPRESSION: Insertion of left-sided chest tube with tip at the left apex. No visible left pneumothorax. Resolution of previously noted left apical mass with minimal residual apical opacity. Small amount of right fissural fluid. Electronically Signed   By: Donavan Foil M.D.   On: 12/19/2019 18:59       Assessment/Plan: S/P Procedure(s) (LRB): THORACIC LAMINECTOMY FOR TUMOR, LEFT THORACIC TWO- THORACIC THREE (Left) VIDEO BRONCHOSCOPY (N/A) VIDEO ASSISTED THORACOSCOPY (VATS)/RESECTION POSTERIOR MEDIASTINAL MASS WITH INTERCOSTAL NERVE BLOCK (Left) Mobilize d/c tubes/lines Wait on starting Lovenox per neurosurgery-currently with compression stockings and increasing mobility today Expected Acute  Blood - loss Anemia- continue to monitor  Final path pending   Grace Isaac 12/20/2019 8:03 AM

## 2019-12-20 NOTE — Discharge Instructions (Signed)
Thoracoscopy, Care After This sheet gives you information about how to care for yourself after your procedure. Your health care provider may also give you more specific instructions. If you have problems or questions, contact your health care provider. What can I expect after the procedure? After the procedure, it is common to have pain and soreness in the surgical area. Follow these instructions at home: Incision care   Follow instructions from your health care provider about how to take care of your incision. Make sure you: ? Wash your hands with soap and water before you change your bandage (dressing). If soap and water are not available, use hand sanitizer. ? Change your dressing as told by your health care provider. ? Leave stitches (sutures), skin glue, or adhesive strips in place. These skin closures may need to stay in place for 2 weeks or longer. If adhesive strip edges start to loosen and curl up, you may trim the loose edges. Do not remove adhesive strips completely unless your health care provider tells you to do that.  Check your incision areas every day for signs of infection. Check for: ? Redness, swelling, or pain. ? Fluid or blood. ? Warmth. ? Pus or a bad smell.  Do not take baths, swim, or use a hot tub until your health care provider approves. You may take showers. Medicines  Take over-the-counter and prescription medicines only as told by your health care provider.  If you were prescribed an antibiotic medicine, take it as told by your health care provider. Do not stop taking the antibiotic even if you start to feel better.  Do not drive or use heavy machinery while taking prescription pain medicine.  If you are taking prescription pain medicine, take actions to prevent or treat constipation. Your health care provider may recommend that you: ? Drink enough fluid to keep your urine pale yellow. ? Eat foods that are high in fiber, such as fresh fruits and vegetables,  whole grains, and beans. ? Limit foods that are high in fat and processed sugars, such as fried and sweet foods. ? Take an over-the-counter or prescription medicine for constipation. Managing pain, stiffness, and swelling   If directed, put ice on the affected area: ? Put ice in a plastic bag. ? Place a towel between your skin and the bag. ? Leave the ice on for 20 minutes, 2-3 times a day. Preventing lung infection  To prevent pneumonia and to keep your lungs healthy: ? Try to cough often. If it hurts to cough, hold a pillow against your chest as you cough. ? Take deep breaths or do breathing exercises as instructed by your health care provider. ? If you were given an incentive spirometer, use it as directed by your health care provider. General instructions  Do not lift anything that is heavier than 10 lb (4.5 kg), or the limit that you are told, until your health care provider says that it is safe.  Do not use any products that contain nicotine or tobacco, such as cigarettes and e-cigarettes. These can delay healing after surgery. If you need help quitting, ask your health care provider.  Avoid driving until your health care provider approves.  If you have a chest drainage tube, care for it as instructed by your health care provider. Do not travel by airplane after the chest drainage tube is removed until your health care provider approves.  Keep all follow-up visits as told by your health care provider. This is important. Contact   a health care provider if:  You have a fever.  Pain medicines do not ease your pain.  You have redness, swelling, or increasing pain in your incision area.  You develop a cough that does not go away, or you are coughing up mucus that is yellow or green. Get help right away if:  You have fluid, blood, or pus coming from your incision.  There is a bad smell coming from your incision or dressing.  You develop a rash.  You cough up blood.  You  develop light-headedness, or you feel faint.  You have difficulty breathing.  You develop chest pain.  Your heartbeat feels irregular or very fast. These symptoms may represent a serious problem that is an emergency. Do not wait to see if the symptoms will go away. Get medical help right away. Call your local emergency services (911 in the [place] Do not drive yourself to the hospital. Summary  Follow instructions from your health care provider about how to take care of your incision.  Do not drive or use heavy machinery while taking prescription pain medicine.  Leave stitches (sutures), skin glue, or adhesive strips in place.  Check your incision areas every day for signs of infection. This information is not intended to replace advice given to you by your health care provider. Make sure you discuss any questions you have with your health care provider. Document Revised: 02/12/2017 Document Reviewed: 02/09/2017 Elsevier Patient Education  2020 [employer] Inc.  

## 2019-12-20 NOTE — Discharge Summary (Signed)
Physician Discharge Summary  Patient ID: Brittany Morgan MRN: [id number] DOB/AGE: October 08, 1970 49 y.o.  Admit date: 12/19/2019 Discharge date: 12/22/2019  Admission Diagnoses: Lung mass  Discharge Diagnoses:  Active Problems:   Chest mass   Mass in chest   Patient Active Problem List   Diagnosis Date Noted  . Chest mass 12/19/2019  . Mass in chest 12/19/2019  . 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 Hyperlipidemia 06/24/2016  . Depressive disorder 06/15/2016  . Hypertensive disorder 06/15/2016   History of Present Illness:   at time of consultation Brittany Morgan 49 y.o. female is seen in the office   for evaluation of a left upper chest posterior mediastinal  cystic lesion.  Evaluation started when the patient went to the emergency room "feeling " bad with elevated blood pressure.  A CT scan of the chest was done while she was in the emergency room that showed a large left upper chest cystic structure, further evaluation with PET scan and MRI of the chest has been done.  The patient has no previous history of smoking.  She denies any complaint of pain in the left chest, has no neurologic complaints in the lower extremities or paresthesias on the left chest she has had some mild shortness of breath with exertion.  She has 3 children the middle child now at age 8 had a neuroblastoma 2 years, treated with adrenalectomy, whole body radiation and bone marrow transplant.  Subsequently developed breast cancer.    Since last seen the patient has been for neurosurgical evaluation with Dr. Kathyrn Sheriff.  Patient and her studies were evaluated by both Dr. Servando Snare and Dr. Kathyrn Sheriff and it was determined that she was a suitable candidate for elective resection.  She was admitted electively for the procedure.   Discharged Condition: good  Hospital Course: The patient was admitted electively and on 12/20/2019 taken the operating room for combined neurosurgery and thoracic surgical procedure as  outlined below.  She tolerated this procedure very well and was taken to the postanesthesia care unit in stable condition.  Postoperative hospital course:  The patient has done very well.  She has maintained stable hemodynamics although notable elevation in her blood pressures requiring adjustments and additional antihypertensive medication regimen.  She has maintained sinus rhythm.  She is neurologically intact without any new deficits.  Pathology has shown the mass to be a benign schwannoma.  Full details of the pathology report are listed below.  Oxygen has been weaned and she maintains good saturations on room air.  She has had some postoperative nausea which has slowly resolved over time with usual measures.  Diet has been advanced and she appears to be tolerating adequately.  She has a expected acute blood loss anemia which is stable.  Most recent hemoglobin hematocrit dated 12/21/2019 are 9.2 and 27.3 respectively.  Renal function has remained within normal limits and she has had good postoperative urine output.  Incisions are noted to be healing well without evidence of infection.  Pain is under good control on oral low-dose narcotics.  She is maintaining normal sinus rhythm.  At the time of discharge the patient is felt to be quite stable.  Consults: neurosurgery  Significant Diagnostic Studies: routine labs and CXR's post-op SURGICAL PATHOLOGY Surgical pathology Collected: 12/19/19 1018  Lab status: Final-Edited  Resulting lab: [facility]  Value: SURGICAL PATHOLOGY  CASE: [id number]  PATIENT: Brittany Morgan  Surgical Pathology Report      Clinical History: Schwannoma of  nerve of chest (cm)      FINAL MICROSCOPIC DIAGNOSIS:   A. THORACIC TWO TUMOR, LEFT, EXCISION:  - Benign schwannoma.   B. INTRAMUSCULAR THORACIC LESION, EXCISION:  - Benign schwannoma.   C. MEDIASTINAL MASS, PARTIAL POSTERIOR, RESECTION:  - Benign schwannoma.   D. MEDIASTINAL MASS,  PARTIAL POSTERIOR, RESECTION:  - Benign schwannoma.    INTRAOPERATIVE DIAGNOSIS:   C. 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 Melina Copa at 6195 on 12/19/2019.   GROSS DESCRIPTION:   A: The specimen is received fresh and consists of a 2.0 x 1.6 x 0.8 cm  aggregate of tan-white soft tissue. Representative sections are  submitted in 1 cassette.   B: The specimen is received fresh, and consists of a 3.4 x 1.6 x 1.4 cm  nodule of tan-pink fibrous tissue. Sectioning reveals a tan-white,  glistening cut surface. Representative sections are submitted in 3  cassettes.   C: The specimen is received fresh for rapid intraoperative consultation  and consists of a 7.8 x 3.5 x 1.4 cm focally disrupted portion of  tan-pink soft tissue. Sectioning reveals tan, glistening cut surfaces.  Representative section is submitted for frozen section analysis.  Representative sections are submitted in 5 cassettes.  1 = frozen section remnant  2-5 = additional sections of lesion   D: The specimen is received fresh and consists of an 18.6 x 5.7 x 3.5 cm  portion of tan-pink, glistening soft tissue. An additional 9.0 x 4.0 x  1.5 cm aggregate of tan-red fibromembranous tissue is present.  Sectioning reveals tan, glistening, homogenous cut surfaces.  Representative sections are submitted in 5 cassettes. Craig Staggers 12/19/2019)     Final Diagnosis performed by Vicente Males, MD.  Electronically signed  12/20/2019  Technical component performed at [facility]. [facility], [place], [place] [address].  Professional component performed at [facility],  [place] [address], [place], [place] [address].  Immunohistochemistry Technical component (if applicable) was performed  at [facility]. [address] [place],  [place], [place] [address].  IMMUNOHISTOCHEMISTRY DISCLAIMER  (if applicable):  Some of these immunohistochemical stains may have been developed and the  performance characteristics determine by [facility]. Some  may not have been cleared or approved by the U.S. Food and Drug  Administration. The FDA has determined that such clearance or approval  is not necessary. This test is used for clinical purposes. It should not  be regarded as investigational or for research. This laboratory is  certified under the [place]  (CLIA-88) as qualified to perform high complexity clinical laboratory  testing. The controls stained appropriately.     Treatments: surgery:  NEUROSURGERY OPERATIVE NOTE   PREOP DIAGNOSIS:  1. Thoracic tumor   POSTOP DIAGNOSIS: Same  PROCEDURE: 1. Left T2 laminectomy with facetectomy, transpedicular approach for resection of tumor 2. Resection of intramuscular thoracic tumor 3. Use of microscope for microdissection   SURGEON: Dr. [name], MD  ASSISTANT: Ferne Reus, PA-C  ANESTHESIA: General Endotracheal  EBL: 100cc    12/19/2019  6:17 PM  PATIENT:  Brittany Morgan  49 y.o. female  PRE-OPERATIVE DIAGNOSIS:   LEFT UPPER CHEST CYSTIC STRUCTURE (probably arising from the T2 NEURO FORAMEN, possibly a SCHWANNOMA) ASSOCIATED ENHANCING NODULE in the LEFT PARASPINOUS MUSCLE  POST-OPERATIVE DIAGNOSIS: LEFT UPPER CHEST CYSTIC STRUCTURE (probably arising from the T2 NEURO FORAMEN, possibly a SCHWANNOMA) ASSOCIATED  ENHANCING NODULE in the LEFT PARASPINOUS MUSCLE  PROCEDURE:  VIDEO BRONCHOSCOPY,  LEFT VIDEO ASSISTED THORACOSCOPY (VATS), RESECTION of LEFT POSTERIOR CHEST MASS  SURGEON:  Surgeon(s) and Role: Panel 1:    [name], MD - Primary Panel 2:    Grace Isaac, MD - Primary    Lajuana Matte, MD - Assisting  PHYSICIAN ASSISTANT: 1. Lars Pinks PA-C 2. Jadene Pierini PA-C  ANESTHESIA:   general   Discharge  Exam: Blood pressure (!) 128/50, pulse 82, temperature 98.2 F (36.8 C), temperature source Oral, resp. rate 12, height 5\' 6"  (1.676 m), weight 97.5 kg, SpO2 98 %.  General appearance: alert, cooperative and no distress Heart: regular rate and rhythm Lungs: min dim in lower fields Abdomen: benign Extremities: no edemaor calf tenderness Wound: incis healing well  Disposition: Discharge disposition: 01-Home or Self Care       Discharge Instructions    Discharge patient   Complete by: As directed    Discharge disposition: 01-Home or Self Care   Discharge patient date: 12/22/2019     Allergies as of 12/22/2019   No Known Allergies     Medication List    STOP taking these medications   ondansetron 4 MG disintegrating tablet Commonly known as: Zofran ODT     TAKE these medications   albuterol 108 (90 Base) MCG/ACT inhaler Commonly known as: VENTOLIN HFA Inhale 1-2 puffs into the lungs every 6 (six) hours as needed for wheezing or shortness of breath.   amLODipine 10 MG tablet Commonly known as: NORVASC Take 1 tablet (10 mg total) by mouth daily. Start taking on: December 23, 2019   ferrous [id number] C-folic acid capsule Commonly known as: TRINSICON / FOLTRIN Take 1 capsule by mouth 2 (two) times daily after a meal.   FLUoxetine 10 MG tablet Commonly known as: PROZAC Take 1 tablet (10 mg total) by mouth daily.   hydrochlorothiazide 12.5 MG tablet Commonly known as: HYDRODIURIL Take 1 tablet (12.5 mg total) by mouth daily.   lisinopril 20 MG tablet Commonly known as: ZESTRIL Take 1 tablet (20 mg total) by mouth daily. Start taking on: December 23, 2019 What changed:   medication strength  how much to take   multivitamin with minerals Tabs tablet Take 1 tablet by mouth daily.   oxyCODONE 5 MG immediate release tablet Commonly known as: Oxy IR/ROXICODONE Take 1 tablet (5 mg total) by mouth every 6 (six) hours as needed for up to 7 days for moderate  pain.       Follow-up Information    Grace Isaac, MD Follow up.   Specialty: Cardiothoracic Surgery Why: Please see discharge paperwork for follow-up appointments with surgeon.  On the date you see Dr. Servando Snare obtain a chest x-ray at [facility] 1/2-hour prior to appointment.  It is located in the same office complex on the first floor. Contact information: [place] [address] [place] [address] [phone]        [name], MD Follow up in 2 week(s).   Specialty: Neurosurgery Contact information: [address] [place] [address] [place] [address] [phone]        [facility] Follow up.   Specialty: Cardiothoracic Surgery Why: suture removal 12/27/19 at 10:30 am Contact information: [place], [place].   Why: Oct. 25, 2021 @9AM  Contact information: [address]  [place] [address] [phone]              Signed: John Giovanni PA-C 12/22/2019, 1:42 PM

## 2019-12-20 NOTE — Progress Notes (Addendum)
  NEUROSURGERY PROGRESS NOTE   No issues overnight.  Complains of appropriate thoracic spine soreness No new N/T/W in extremities  EXAM:  BP (!) 135/97 (BP Location: Left Arm)   Pulse 85   Temp 97.6 F (36.4 C) (Oral)   Resp 20   Ht 5\' 6"  (1.676 m)   Wt 97.5 kg   SpO2 95%   BMI 34.69 kg/m   Awake, alert, oriented  Speech fluent, appropriate  CN grossly intact  5/5 BUE/BLE  Incision: c/d/i  IMPRESSION/PLAN 49 y.o. female POD1 s/p two stage procedure for resection of thoracic tumor. Doing well. - okay to mobilize from [place] for DVT prophylaxis until tomorrow    I have seen and examined Brittany Morgan and agree with the exam, impression, and plan as documented in the note by Ferne Reus, PA-C.  POD#1 s/p resection of thoracic Schwannoma, doing well at neurologic baseline - Ambulate as tolerated - Cont to monitor neurologic exam  [name], MD [facility]

## 2019-12-20 NOTE — Progress Notes (Signed)
Felt nauseous after eating clear liquid breakfast, zofran given. Continue to monitor.

## 2019-12-20 NOTE — Anesthesia Postprocedure Evaluation (Signed)
Anesthesia Post Note  Patient: PHILLIPPA STRAUB  Procedure(s) Performed: THORACIC LAMINECTOMY FOR TUMOR, LEFT THORACIC TWO- THORACIC THREE (Left Neck) VIDEO BRONCHOSCOPY (N/A ) VIDEO ASSISTED THORACOSCOPY (VATS)/RESECTION POSTERIOR MEDIASTINAL MASS WITH INTERCOSTAL NERVE BLOCK (Left Chest)     Patient location during evaluation: PACU Anesthesia Type: General Level of consciousness: awake and alert Pain management: pain level controlled Vital Signs Assessment: post-procedure vital signs reviewed and stable Respiratory status: spontaneous breathing, nonlabored ventilation, respiratory function stable and patient connected to nasal cannula oxygen Cardiovascular status: blood pressure returned to baseline and stable Postop Assessment: no apparent nausea or vomiting Anesthetic complications: no   No complications documented.                Effie Berkshire

## 2019-12-21 ENCOUNTER — Inpatient Hospital Stay (HOSPITAL_COMMUNITY): Payer: Self-pay

## 2019-12-21 LAB — COMPREHENSIVE METABOLIC PANEL
ALT: 11 U/L (ref 0–44)
AST: 30 U/L (ref 15–41)
Albumin: 2.8 g/dL — ABNORMAL LOW (ref 3.5–5.0)
Alkaline Phosphatase: 35 U/L — ABNORMAL LOW (ref 38–126)
Anion gap: 6 (ref 5–15)
BUN: 7 mg/dL (ref 6–20)
CO2: 29 mmol/L (ref 22–32)
Calcium: 8.4 mg/dL — ABNORMAL LOW (ref 8.9–10.3)
Chloride: 109 mmol/L (ref 98–111)
Creatinine, Ser: 0.95 mg/dL (ref 0.44–1.00)
GFR calc non Af Amer: >60 mL/min (ref >60)
Glucose, Bld: 104 mg/dL — ABNORMAL HIGH (ref 70–99)
Potassium: 3.5 mmol/L (ref 3.5–5.1)
Sodium: 144 mmol/L (ref 135–145)
Total Bilirubin: 0.6 mg/dL (ref 0.3–1.2)
Total Protein: 5.2 g/dL — ABNORMAL LOW (ref 6.5–8.1)

## 2019-12-21 LAB — CBC
HCT: 27.3 % — ABNORMAL LOW (ref 36.0–46.0)
Hemoglobin: 9.2 g/dL — ABNORMAL LOW (ref 12.0–15.0)
MCH: 30.4 pg (ref 26.0–34.0)
MCHC: 33.7 g/dL (ref 30.0–36.0)
MCV: 90.1 fL (ref 80.0–100.0)
Platelets: 180 10*3/uL (ref 150–400)
RBC: 3.03 MIL/uL — ABNORMAL LOW (ref 3.87–5.11)
RDW: 14.2 % (ref 11.5–15.5)
WBC: 8.9 10*3/uL (ref 4.0–10.5)
nRBC: 0 % (ref 0.0–0.2)

## 2019-12-21 MED ORDER — POTASSIUM CHLORIDE CRYS ER 20 MEQ PO TBCR
40.0000 meq | EXTENDED_RELEASE_TABLET | Freq: Once | ORAL | Status: AC
  Administered 2019-12-21: 40 meq via ORAL
  Filled 2019-12-21: qty 2

## 2019-12-21 MED ORDER — SODIUM CHLORIDE 0.9% FLUSH: 10.0000 mL | INTRAVENOUS | Status: DC | PRN

## 2019-12-21 MED ORDER — FE FUMARATE-B12-VIT C-FA-IFC PO CAPS
1.0000 | ORAL_CAPSULE | Freq: Two times a day (BID) | ORAL | Status: DC
  Administered 2019-12-21 – 2019-12-22 (×2): 1 via ORAL
  Filled 2019-12-21 (×4): qty 1

## 2019-12-21 MED ORDER — METOCLOPRAMIDE HCL 5 MG/ML IJ SOLN
10.0000 mg | Freq: Three times a day (TID) | INTRAMUSCULAR | Status: DC
  Administered 2019-12-21 (×2): 10 mg via INTRAVENOUS
  Filled 2019-12-21 (×2): qty 2

## 2019-12-21 MED ORDER — SODIUM CHLORIDE 0.9% FLUSH
10.0000 mL | Freq: Two times a day (BID) | INTRAVENOUS | Status: DC
  Administered 2019-12-22: 10 mL

## 2019-12-21 MED ORDER — SCOPOLAMINE 1 MG/3DAYS TD PT72
1.0000 | MEDICATED_PATCH | TRANSDERMAL | Status: DC
  Administered 2019-12-21: 1.5 mg via TRANSDERMAL
  Filled 2019-12-21 (×2): qty 1

## 2019-12-21 MED ORDER — LISINOPRIL 20 MG PO TABS
20.0000 mg | ORAL_TABLET | Freq: Every day | ORAL | Status: DC
  Administered 2019-12-21 – 2019-12-22 (×2): 20 mg via ORAL
  Filled 2019-12-21 (×2): qty 1

## 2019-12-21 MED ORDER — LOPERAMIDE HCL 2 MG PO CAPS
2.0000 mg | ORAL_CAPSULE | ORAL | Status: DC | PRN
  Administered 2019-12-21: 2 mg via ORAL
  Filled 2019-12-21: qty 1

## 2019-12-21 NOTE — Progress Notes (Addendum)
[address]       [place],[address] [address]             [phone]      2 Days Post-Op Procedure(s) (LRB): THORACIC LAMINECTOMY FOR TUMOR, LEFT THORACIC TWO- THORACIC THREE (Left) VIDEO BRONCHOSCOPY (N/A) VIDEO ASSISTED THORACOSCOPY (VATS)/RESECTION POSTERIOR MEDIASTINAL MASS WITH INTERCOSTAL NERVE BLOCK (Left) Subjective:  some  Nausea- improving Some SOB- improving No BM, + flatus  Objective: Vital signs in last 24 hours: Temp:  [97.6 F (36.4 C)-99.1 F (37.3 C)] 98.5 F (36.9 C) (10/07 0411) Pulse Rate:  [78-93] 90 (10/07 0411) Cardiac Rhythm: Normal sinus rhythm (10/07 0400) Resp:  [15-27] 17 (10/07 0411) BP: (129-158)/(90-104) 156/103 (10/07 0411) SpO2:  [92 %-96 %] 96 % (10/07 0411) Arterial Line BP: (148)/(98) 148/98 (10/06 0900)  Hemodynamic parameters for last 24 hours:    Intake/Output from previous day: 10/06 0701 - 10/07 0700 In: 1397.4 [P.O.:250; I.V.:1147.4] Out: 170 [Chest Tube:170] Intake/Output this shift: Total I/O In: 547.4 [I.V.:547.4] Out: 60 [Chest Tube:60]    Exam Alert, NAD Lungs- clear Cor- RRR Abdomen- benign, +BS Ext- no edema or calf tenderness Dressings - clean   Lab Results: Recent Labs    12/20/19 0430 12/21/19 0412  WBC 10.6* 8.9  HGB 9.8* 9.2*  HCT 28.7* 27.3*  PLT 207 180   BMET:  Recent Labs    12/20/19 0430 12/21/19 0412  NA 143 144  K 3.7 3.5  CL 109 109  CO2 25 29  GLUCOSE 113* 104*  BUN 11 7  CREATININE 1.18* 0.95  CALCIUM 8.2* 8.4*    PT/INR: No results for input(s): LABPROT, INR in the last 72 hours. ABG    Component Value Date/Time   PHART 7.387 12/20/2019 0403   HCO3 25.1 12/20/2019 0403   TCO2 24 12/19/2019 1614   ACIDBASEDEF 1.0 12/19/2019 1518   O2SAT 95.8 12/20/2019 0403   CBG (last 3)  No results for input(s): GLUCAP in the last 72 hours.  Meds Scheduled Meds: . acetaminophen  1,000 mg Oral Q6H   Or  . acetaminophen (TYLENOL) oral liquid 160 mg/5 mL  1,000  mg Oral Q6H  . bisacodyl  10 mg Oral Daily  . Chlorhexidine Gluconate Cloth  6 each Topical Daily  . enoxaparin (LOVENOX) injection  40 mg Subcutaneous Daily  . FLUoxetine  10 mg Oral Daily  . hydrochlorothiazide  12.5 mg Oral Daily  . lisinopril  10 mg Oral Daily  . multivitamin with minerals  1 tablet Oral Daily  . senna-docusate  1 tablet Oral QHS   Continuous Infusions: . sodium chloride    . 0.9 % NaCl with KCl 20 mEq / L 50 mL/hr at 12/21/19 0600   PRN Meds:.Place/Maintain arterial line **AND** sodium chloride, fentaNYL (SUBLIMAZE) injection, levalbuterol, ondansetron (ZOFRAN) IV, oxyCODONE, traMADol  Xrays DG Thoracic Spine 2 View  Result Date: 12/19/2019 CLINICAL DATA:  T2-T3 laminectomy for tumor excision EXAM: THORACIC SPINE 2 VIEWS; DG C-ARM 1-60 MIN COMPARISON:  MR thoracic spine, 11/17/2019 FLUOROSCOPY TIME:  11 seconds FINDINGS: Intraoperative fluoroscopic views of the upper thoracic spine demonstrate retractors and marking instruments overlying the T2 vertebral body. Support apparatus including endotracheal tube and esophagogastric tube. IMPRESSION: Intraoperative fluoroscopic views demonstrate retractors and marking instruments overlying the T2 vertebral body. Electronically Signed   By: Eddie Candle M.D.   On: 12/19/2019 11:42   DG CHEST PORT 1 VIEW  Result Date: 12/20/2019 CLINICAL DATA:  Chest tube present.  Pneumothorax.  EXAM: PORTABLE CHEST 1 VIEW COMPARISON:  12/19/2019 FINDINGS: LEFT-sided chest tube appears unchanged in position. LEFT IJ central line tip overlies the superior vena cava/brachiocephalic confluence. Heart is enlarged and stable in configuration. No pneumothorax. Minimal RIGHT perihilar and RIGHT basilar atelectasis. No edema. There is persistent opacity in the MEDIAL LEFT lung apex following removal of LEFT apical mass. IMPRESSION: 1. Stable cardiomegaly. 2. Stable LEFT chest tube. No pneumothorax. Electronically Signed   By: Nolon Nations M.D.   On:  12/20/2019 08:25   DG Chest Port 1 View  Result Date: 12/19/2019 CLINICAL DATA:  Postop EXAM: PORTABLE CHEST 1 VIEW COMPARISON:  12/15/2019, CT 10/29/2019, PET CT 11/07/2019 FINDINGS: Left-sided central venous catheter with tip projecting over the venous confluence. Insertion of left-sided chest tube with tip at the left apex. No visible left pneumothorax. Small amount of right fissural fluid. Enlarged cardiomediastinal silhouette. Resolved left apical mass with minimal residual opacity. Mild tracheal deviation to the right. IMPRESSION: Insertion of left-sided chest tube with tip at the left apex. No visible left pneumothorax. Resolution of previously noted left apical mass with minimal residual apical opacity. Small amount of right fissural fluid. Electronically Signed   By: Donavan Foil M.D.   On: 12/19/2019 18:59   DG C-Arm 1-60 Min  Result Date: 12/19/2019 CLINICAL DATA:  T2-T3 laminectomy for tumor excision EXAM: THORACIC SPINE 2 VIEWS; DG C-ARM 1-60 MIN COMPARISON:  MR thoracic spine, 11/17/2019 FLUOROSCOPY TIME:  11 seconds FINDINGS: Intraoperative fluoroscopic views of the upper thoracic spine demonstrate retractors and marking instruments overlying the T2 vertebral body. Support apparatus including endotracheal tube and esophagogastric tube. IMPRESSION: Intraoperative fluoroscopic views demonstrate retractors and marking instruments overlying the T2 vertebral body. Electronically Signed   By: Eddie Candle M.D.   On: 12/19/2019 11:42    Assessment/Plan: S/P Procedure(s) (LRB): THORACIC LAMINECTOMY FOR TUMOR, LEFT THORACIC TWO- THORACIC THREE (Left) VIDEO BRONCHOSCOPY (N/A) VIDEO ASSISTED THORACOSCOPY (VATS)/RESECTION POSTERIOR MEDIASTINAL MASS WITH INTERCOSTAL NERVE BLOCK (Left)  POD#2  1 tmax 99.1, + hypertensive, will increase ACE-I dose 2 sinus rhythm 3 sats good on RA 4 normal renal fxn 5 expected ABL anemia is fairly stable- monitor clinically , add trinsicon 6 BS adeq  controlled for hospitalized patient- no DM per hx 7 CT drainage 170 cc /24 hours, no air leak, poss d/c today 8 CXR minor atx, no effusions, no pntx 9 final path- benign schwannoma 10 poss home 1-2 days  LOS: 2 days    John Giovanni PA-C Pager [phone] 12/21/2019  Plan d/c chest tube today D/c central line Try few doses of reglan for nausea and avoid narcotics as much as possible I have seen and examined [name] and agree with the above assessment  and plan.  Grace Isaac MD Beeper [phone] Office [phone] 12/21/2019 8:39 AM

## 2019-12-21 NOTE — Progress Notes (Signed)
  NEUROSURGERY PROGRESS NOTE   No issues overnight. Husband at beside Cyclical nausea thought to be due to ?toradol Otherwise doing well. Able to ambulate yesterday  EXAM:  BP (!) 145/102 (BP Location: Right Arm)   Pulse 74   Temp 99 F (37.2 C) (Oral)   Resp 17   Ht 5\' 6"  (1.676 m)   Wt 97.5 kg   SpO2 97%   BMI 34.69 kg/m   Awake, alert, oriented  Speech fluent, appropriate  CN grossly intact  5/5 BUE/BLE  Thoracic incision: dried blood. No active drainage  IMPRESSION/PLAN 49 y.o. female POD2 s/p two stage procedure for resection of thoracic tumor, benign schwanomma. Doing well.  - ?toradol causing nausea. Nursing to hold next dose and monitor sx. - continue to mobilize

## 2019-12-21 NOTE — Progress Notes (Signed)
TCTS PA made aware about the cont. LBM. Gave order. immodium 1 capsule given.  Continue to monitor.

## 2019-12-21 NOTE — Progress Notes (Signed)
With loose stool x4 brown in color in mod.amount. TCTS PA made aware with order to d/c dulcolax. Continue to monitor.

## 2019-12-22 ENCOUNTER — Inpatient Hospital Stay (HOSPITAL_COMMUNITY): Payer: Self-pay

## 2019-12-22 ENCOUNTER — Other Ambulatory Visit (HOSPITAL_COMMUNITY): Payer: Self-pay | Admitting: Physician Assistant

## 2019-12-22 MED ORDER — AMLODIPINE BESYLATE 5 MG PO TABS
5.0000 mg | ORAL_TABLET | Freq: Every day | ORAL | Status: DC
  Administered 2019-12-22: 5 mg via ORAL
  Filled 2019-12-22: qty 1

## 2019-12-22 MED ORDER — AMLODIPINE BESYLATE 10 MG PO TABS
10.0000 mg | ORAL_TABLET | Freq: Every day | ORAL | 1 refills | Status: DC
Start: 2019-12-23 — End: 2020-02-21

## 2019-12-22 MED ORDER — LISINOPRIL 20 MG PO TABS
20.0000 mg | ORAL_TABLET | Freq: Every day | ORAL | 1 refills | Status: DC
Start: 2019-12-23 — End: 2020-02-21

## 2019-12-22 MED ORDER — ALBUTEROL SULFATE HFA 108 (90 BASE) MCG/ACT IN AERS: 1.0000 | INHALATION_SPRAY | Freq: Four times a day (QID) | RESPIRATORY_TRACT | 2 refills | Status: DC | PRN

## 2019-12-22 MED ORDER — OXYCODONE HCL 5 MG PO TABS
5.0000 mg | ORAL_TABLET | Freq: Four times a day (QID) | ORAL | 0 refills | Status: DC | PRN
Start: 2019-12-22 — End: 2019-12-22

## 2019-12-22 MED ORDER — FE FUMARATE-B12-VIT C-FA-IFC PO CAPS: 1.0000 | ORAL_CAPSULE | Freq: Two times a day (BID) | ORAL | 1 refills | Status: DC

## 2019-12-22 MED FILL — FEROCON CAPSULE: 30 days supply | Qty: 60 | Fill #0

## 2019-12-22 MED FILL — oxyCODONE HCL 5 MG TABS: 5 | 7 days supply | Qty: 28 | Fill #0

## 2019-12-22 MED FILL — AMLODIPINE BESYLATE 10 MG T: 10 | 30 days supply | Qty: 30 | Fill #0

## 2019-12-22 MED FILL — ALBUTEROL SULFATE HFA 108 (: 108 (90 BAS | 20 days supply | Qty: 18 | Fill #0

## 2019-12-22 MED FILL — LISINOPRIL 20 MG TABLET: 20 | 30 days supply | Qty: 30 | Fill #0

## 2019-12-22 NOTE — Progress Notes (Addendum)
[address]       [place] [address]             [phone]      3 Days Post-Op Procedure(s) (LRB): THORACIC LAMINECTOMY FOR TUMOR, LEFT THORACIC TWO- THORACIC THREE (Left) VIDEO BRONCHOSCOPY (N/A) VIDEO ASSISTED THORACOSCOPY (VATS)/RESECTION POSTERIOR MEDIASTINAL MASS WITH INTERCOSTAL NERVE BLOCK (Left) Subjective: Got SOB last night , on 2 liters [place], feels better this am  Objective: Vital signs in last 24 hours: Temp:  [98.6 F (37 C)-99.2 F (37.3 C)] 98.9 F (37.2 C) (10/08 0403) Pulse Rate:  [73-96] 90 (10/08 0403) Cardiac Rhythm: Normal sinus rhythm (10/08 0300) Resp:  [14-18] 18 (10/08 0403) BP: (145-168)/(100-115) 152/115 (10/08 0403) SpO2:  [93 %-98 %] 98 % (10/08 0403)  Hemodynamic parameters for last 24 hours:    Intake/Output from previous day: 10/07 0701 - 10/08 0700 In: 50 [I.V.:50] Out: -  Intake/Output this shift: No intake/output data recorded.  General appearance: alert, cooperative and no distress Heart: regular rate and rhythm Lungs: min dim in lower fields Abdomen: benign Extremities: no edemaor calf tenderness Wound: incis healing well  Lab Results: Recent Labs    12/20/19 0430 12/21/19 0412  WBC 10.6* 8.9  HGB 9.8* 9.2*  HCT 28.7* 27.3*  PLT 207 180   BMET:  Recent Labs    12/20/19 0430 12/21/19 0412  NA 143 144  K 3.7 3.5  CL 109 109  CO2 25 29  GLUCOSE 113* 104*  BUN 11 7  CREATININE 1.18* 0.95  CALCIUM 8.2* 8.4*    PT/INR: No results for input(s): LABPROT, INR in the last 72 hours. ABG    Component Value Date/Time   PHART 7.387 12/20/2019 0403   HCO3 25.1 12/20/2019 0403   TCO2 24 12/19/2019 1614   ACIDBASEDEF 1.0 12/19/2019 1518   O2SAT 95.8 12/20/2019 0403   CBG (last 3)  No results for input(s): GLUCAP in the last 72 hours.  Meds Scheduled Meds: . acetaminophen  1,000 mg Oral Q6H   Or  . acetaminophen (TYLENOL) oral liquid 160 mg/5 mL  1,000 mg Oral Q6H  . Chlorhexidine  Gluconate Cloth  6 each Topical Daily  . enoxaparin (LOVENOX) injection  40 mg Subcutaneous Daily  . ferrous [id number] C-folic acid  1 capsule Oral BID PC  . FLUoxetine  10 mg Oral Daily  . hydrochlorothiazide  12.5 mg Oral Daily  . lisinopril  20 mg Oral Daily  . multivitamin with minerals  1 tablet Oral Daily  . scopolamine  1 patch Transdermal Q72H  . sodium chloride flush  10-40 mL Intracatheter Q12H   Continuous Infusions: . sodium chloride     PRN Meds:.Place/Maintain arterial line **AND** sodium chloride, fentaNYL (SUBLIMAZE) injection, levalbuterol, loperamide, ondansetron (ZOFRAN) IV, oxyCODONE, sodium chloride flush, traMADol  Xrays DG Chest Port 1 View  Result Date: 12/21/2019 CLINICAL DATA:  Shortness of breath. EXAM: PORTABLE CHEST 1 VIEW COMPARISON:  December 20, 2019. FINDINGS: Stable cardiomegaly. Left internal jugular catheter is unchanged in position. Stable position of left-sided chest tube. No pneumothorax is noted. Fluid is noted in the right minor fissure. Lungs are clear. Bony thorax is unremarkable. IMPRESSION: Stable position of left-sided chest tube without pneumothorax. Fluid is noted in the right minor fissure. Electronically Signed   By: Marijo Conception M.D.   On: 12/21/2019 08:07    Assessment/Plan: S/P Procedure(s) (LRB): THORACIC LAMINECTOMY FOR TUMOR, LEFT THORACIC TWO- THORACIC THREE (Left) VIDEO BRONCHOSCOPY (N/A) VIDEO ASSISTED  THORACOSCOPY (VATS)/RESECTION POSTERIOR MEDIASTINAL MASS WITH INTERCOSTAL NERVE BLOCK (Left)  1 overall feel ing better, with improved nausea- only on clear liquids though 2 BP control is poor, will add norvasc. No fevers 3 sats good on RA but back on O2 this morning, try to wean off again this am 4 CXR is stable  5 no new labs 6 poss home later today v tomorrow depending on how she feels and progress  LOS: 3 days    John Giovanni  PA-C Pager [phone] 12/22/2019    feels better this am, advance diet Poss  home later today depending how she feels ambulating  I have seen and examined Brittany Morgan and agree with the above assessment  and plan.  Grace Isaac MD Beeper [phone] Office [phone] 12/22/2019 10:35 AM

## 2019-12-22 NOTE — Progress Notes (Addendum)
  NEUROSURGERY PROGRESS NOTE   No issues overnight.  Complains of appropriate left sided chest discomfort and thoracic back pain No new N/T/W Nausea much improved with scopolamine patch and d/c of toradol   EXAM:  BP (!) 152/115 (BP Location: Right Arm)   Pulse 90   Temp 98.9 F (37.2 C) (Oral)   Resp 18   Ht 5\' 6"  (1.676 m)   Wt 97.5 kg   SpO2 98%   BMI 34.69 kg/m   Awake, alert, oriented  Speech fluent, appropriate  CN grossly intact  5/5 BUE/BLE  Incision: dried blood, no active drainage  IMPRESSION/PLAN 49 y.o. female POD3 s/p two stage procedure for resection of thoracic tumor, benign schwanomma. Doing well. Advance diet today. Hopefully discharge later this afternoon. Would like to see her in 3 weeks for f/u outpatient    I have seen and examined Brittany Morgan and agree with the exam, impression, and plan as documented in the note by Ferne Reus, PA-C.  POD# 3 s/p resection of thoracic schwannoma, at neurologic baseline. - Cont to ambulate as tolerated - Stable for d/c home from neurologic standpoint when cleared by CTS.  [name], MD [facility]

## 2019-12-22 NOTE — Progress Notes (Signed)
Brittany Morgan, [place] advised that patient will need TOC meds at discharge, TOC closes at 5pm---he will be back around to evaluate patient again for ambulation without oxygen and toleration of solids before deciding about discharge, patient and family in room advised

## 2019-12-24 LAB — TYPE AND SCREEN
ABO/RH(D): A POS
Antibody Screen: NEGATIVE
Unit division: 0
Unit division: 0
Unit division: 0
Unit division: 0

## 2019-12-24 LAB — BPAM RBC
Blood Product Expiration Date: 202110292359
Blood Product Expiration Date: 202110292359
Blood Product Expiration Date: 202110292359
Blood Product Expiration Date: 202110292359
ISSUE DATE / TIME: 202110050812
ISSUE DATE / TIME: 202110050812
ISSUE DATE / TIME: 202110062332
ISSUE DATE / TIME: 202110100739
Unit Type and Rh: 6200
Unit Type and Rh: 6200
Unit Type and Rh: 6200
Unit Type and Rh: 6200

## 2019-12-24 NOTE — Op Note (Signed)
NAME: Brittany Morgan, Brittany Morgan MEDICAL RECORD [id number] ACCOUNT [id number] DATE OF BIRTH:02-24-1971 FACILITY: [facility] LOCATION: [facility] PHYSICIAN:Quinlynn Cuthbert Maryruth Bun, MD  OPERATIVE REPORT  DATE OF PROCEDURE:  12/19/2019  PREOPERATIVE DIAGNOSIS:  Large left chest mass with involvement of T2 nerve root.  POSTOPERATIVE DIAGNOSIS:  Large left chest mass with involvement of T2 nerve root, final pathology pending, clinically suspicious for schwannoma.  PROCEDURE PERFORMED:   1.  Bronchoscopy.   2.  Left video-assisted thoracoscopy with resection of posterior superior mediastinal tumor.   3.  Intercostal nerve block with Exparel.  SURGEON:  Lanelle Bal, MD  FIRST ASSISTANT:  Dr. Kipp Brood.    SECOND ASSISTANTS:  Quincy Simmonds, PA and Jadene Pierini, [place].  BRIEF HISTORY:  The patient is a 49 year old hypertensive female who presented with vague chest discomfort.  This led to a chest x-ray showing a large left upper lobe mass and she was referred to Dr. Raeanne Barry, pulmonary.  Further evaluation, in  consultation with thoracic surgery, we obtained a CT scan of the chest and MRI of the chest to evaluate for possible spinal cord involvement, though the patient had no symptoms or neurologic changes in the lower extremities.  MRI did show involvement of  abnormal mass extending into the T2 nerve root foramen on the left, but without obvious cord involvement or cord compression.  We consulted neurosurgery.  She was seen by Dr. Kathyrn Sheriff and after review of the MRI and being seen by thoracic surgery, we  decided to proceed with a combined operative procedure to resect the portion extending through the neural foramen at T2 and then proceed with resection of the mass.  Clinically, this was suspicious as a schwannoma that had been slow growing.  On  retrospect, chest x-ray in 2008 suggested a small lesion in the left upper posterior chest.  Risks and options were discussed with the patient in detail and she was  willing to proceed.    DESCRIPTION OF PROCEDURE:  The patient was marked preoperatively seen by myself and Dr. Kathyrn Sheriff.  She was then taken to the operating room and underwent general endotracheal anesthesia with a single lumen endotracheal tube.  Appropriate timeout was  performed and video bronchoscopy was done that showed no intrabronchial lesions to the subsegmental level bilaterally.  The patient was then placed in prone position and the back was prepped per Dr. Cleotilde Neer preference and dictated under separate note  of his procedure for laminectomy and resection of the left T2 nerve root.  In addition, the MRI had suggested just a bit adjacent to the area of interest in the back was a nodular area of similar density to the main mass.  This was resected from the  muscle by Dr. Kathyrn Sheriff.  The incision of the laminectomy was then closed.  The patient was then turned back over in the supine position.  The single lumen endotracheal tube was changed to a double lumen endotracheal tube.  The patient was then turned in  lateral decubitus position with the left side up.  The left side had been preoperatively marked.  A second timeout was performed.  The left chest was prepped with Betadine, draped in a sterile manner.  We then proceeded with approximately the 4th  intercostal space anteriorly.  Exparel was infiltrated.  A small incision was made and a 5 mm port was placed into the left chest.  Using Optiview scope and a 5 mm 0 videoscope, we were able to enter the chest without difficulty.  The chest was  explored  visually.  There were significant adhesions posteriorly and superiorly, but the mass itself appeared to be distinct.  A second port was placed along the posterior axillary line, 4th intercostal space under direct vision from our first port site.  With  these 2 port sites, we then used  a [place] and took down adhesions, freeing up the mass and the left upper lobe.  We  then dissected adhesions along the left upper lobe to separate the mass completely.  We then moved more  superior and as the posterior adhesions had been taken down, a third port was placed slightly more superior and posterior than the previous two.  We continued rotating the mass, first posteriorly and freeing any adhesions anteriorly.  The mass did not  involve the middle mediastinum and appeared to be most adherent toward the apex.  We continued our dissection using the 3 ports, a [place] and ultimately had the mass based on a single pedicle to the superior mediastinum.  We were dissecting  posteriorly, taking care not to injure the recurrent nerve as it passed around the aorta more anteriorly than our dissection.  The CT scan had suggested the mass may have been cystic; however, on examination, the mass was somewhat pliable, rubbery in  consistency, but was not cystic.  Ultimately, we were able to dissect the very upper most mediastinal fraying around the area of the nerve root and tissue was removed.  The mass became free, as it had been divided within the T2 foramen.  Because of the  rubbery and size of the mass, we then placed the mass in a nylon retrieval sac.  We then brought the sac out through the middle of our 2 ports.  To get the mass out of the chest,  we had to extend this incision slightly.  Ultimately with the mass  removed, frozen section showed spindle cell proliferation.  Estimated blood loss was 100 mL for the chest portion.  We then used a solution of saline,  Exparel and bupivacaine and using a long needle, infiltrated along each posterior rib to create a  nerve block at the sites of port insertions and chest tube.  With this completed, the lung was reinflated after placement through the most anterior site, a 28 [place] chest tube.  The remaining 2 ports were closed with interrupted 0 Vicryl, running 3-0  Vicryl in subcutaneous tissue.  Dermabond was applied.  The lung  reinflated well.  The patient was then awakened and extubated in the operating room and transferred to the recovery room for postoperative care.  Sponge and needle count was reported as  correct at completion of the procedure.  VN/NUANCE  D:12/24/2019 T:12/24/2019 JOB:[id number]

## 2019-12-27 ENCOUNTER — Other Ambulatory Visit: Payer: Self-pay

## 2019-12-27 ENCOUNTER — Ambulatory Visit (INDEPENDENT_AMBULATORY_CARE_PROVIDER_SITE_OTHER): Payer: Self-pay | Admitting: *Deleted

## 2019-12-27 DIAGNOSIS — Z4802 Encounter for removal of sutures: Secondary | ICD-10-CM

## 2019-12-27 NOTE — Progress Notes (Signed)
Patient arrived for nurse visit to remove sutures post-left VATs and thoracic laminectomy for tumor resection.  Sutures removed with no signs or symptoms of infection noted.  Patient tolerated suture removal well.  Patient and family instructed to keep the incision site clean and dry. Patient and family acknowledged instructions given.  All questions answered.

## 2019-12-28 ENCOUNTER — Other Ambulatory Visit: Payer: Self-pay | Admitting: *Deleted

## 2019-12-28 ENCOUNTER — Other Ambulatory Visit: Payer: Self-pay | Admitting: Cardiothoracic Surgery

## 2019-12-28 DIAGNOSIS — R222 Localized swelling, mass and lump, trunk: Secondary | ICD-10-CM

## 2019-12-28 NOTE — Progress Notes (Signed)
Cancer conference discussion 12/28/19 possible genetic referral. Dr. Servando Snare aware.

## 2020-01-01 ENCOUNTER — Telehealth: Payer: Self-pay

## 2020-01-01 ENCOUNTER — Other Ambulatory Visit: Payer: Self-pay | Admitting: Surgical

## 2020-01-01 MED ORDER — OXYCODONE HCL 5 MG PO TABS: 5.0000 mg | ORAL_TABLET | Freq: Four times a day (QID) | ORAL | 0 refills | Status: AC | PRN

## 2020-01-01 MED ORDER — OXYCODONE HCL 5 MG PO TABS: 5.0000 mg | ORAL_TABLET | Freq: Four times a day (QID) | ORAL | 0 refills | Status: DC | PRN

## 2020-01-01 NOTE — Progress Notes (Signed)
      [address]       [place] [address]             [phone]     Refill of oxycodone 5 mg q6 po prn sent to patients pharmacy    John Giovanni, PA-C

## 2020-01-01 NOTE — Telephone Encounter (Signed)
Patient called requesting a refill of pain medication.  She is s/p Thoracic Laminectomy/ VATS resection of posterior mediastinal mass with Dr. Servando Snare 12/19/2019.  Jadene Pierini, [place] refill prescription and sent into the [facility] on [address] per patient's request.  Patient aware of refill and advised to take sparingly and could also use Tylenol/ Ibuprofen as needed for pain.  She acknowledged receipt.

## 2020-01-02 ENCOUNTER — Telehealth: Payer: Self-pay

## 2020-01-02 NOTE — Telephone Encounter (Signed)
-----   Message from Grace Isaac, MD sent at 01/02/2020 12:49 PM EDT ----- Regarding: RE: Pain medication refill No that is good ----- Message ----- From: Donnella Sham, RN Sent: 01/02/2020  12:48 PM EDT To: Grace Isaac, MD Subject: RE: Pain medication refill                     Yes, that is correct, he sent it in yesterday.  Is there anything I need to tell her? ----- Message ----- From: Grace Isaac, MD Sent: 01/02/2020  12:33 PM EDT To: Donnella Sham, RN Subject: RE: Pain medication refill                     [place] got her some ----- Message ----- From: Donnella Sham, RN Sent: 01/02/2020  12:19 PM EDT To: Grace Isaac, MD Subject: RE: Pain medication refill                     She says that her left side is giving her most of the problem, it is a stabbing pain.  Her incision does not bother her too much.  Her left arm is throbbing as well.  She took a shower today and massaged the left arm and it did give her some relief temporarily, she feels that that worked best.  She also said that she is only taking the Oxycodone at night to help with pain so she can sleep.  Mostly taking Tylenol during the day.  ----- Message ----- From: Grace Isaac, MD Sent: 01/02/2020  11:34 AM EDT To: Donnella Sham, RN Subject: RE: Pain medication refill                     What is hurting ? ----- Message ----- From: Donnella Sham, RN Sent: 01/02/2020   9:38 AM EDT To: Grace Isaac, MD Subject: RE: Pain medication refill                     She was willing to take any medication prescribed for pain.  [place] did prescribe Oxy for 7 days, #28.  I told her to take sparingly and also start Tylenol with occasional ibuprofen for pain.  Advised that this may be her last refill of pain medication.  Caryl Pina ----- Message ----- From: Grace Isaac, MD Sent: 01/02/2020   7:21 AM EDT To: Donnella Sham, RN Subject: RE: Pain medication refill                      See if she can take ultram in stead of oxy , how many oxy did she get , is her side hurting or back incision / ----- Message ----- From: Donnella Sham, RN Sent: 01/01/2020  12:09 PM EDT To: Grace Isaac, MD, John Giovanni, PA-C Subject: Pain medication refill                         Hey,  Patient is requesting refill of pain medication.  Given oxycodone at discharge 10 days ago, s/p VATS resection posterior mediastinal mass/ Thoracic laminectomy.  Patient's pharmacy is:  [facility] [address] [place] - [address]  Phone:  [phone]   Thanks,  Caryl Pina

## 2020-01-04 ENCOUNTER — Ambulatory Visit
Admission: RE | Admit: 2020-01-04 | Discharge: 2020-01-04 | Disposition: A | Discharge status: Home / Self Care | Payer: No Typology Code available for payment source | Source: Ambulatory Visit | Attending: Cardiothoracic Surgery | Admitting: Cardiothoracic Surgery

## 2020-01-04 ENCOUNTER — Ambulatory Visit (INDEPENDENT_AMBULATORY_CARE_PROVIDER_SITE_OTHER): Payer: Self-pay | Admitting: Cardiothoracic Surgery

## 2020-01-04 ENCOUNTER — Other Ambulatory Visit: Payer: Self-pay

## 2020-01-04 VITALS — BP 105/75 | HR 90 | Temp 97.8°F | Resp 20 | Ht 66.0 in | Wt 205.0 lb

## 2020-01-04 DIAGNOSIS — R222 Localized swelling, mass and lump, trunk: Secondary | ICD-10-CM

## 2020-01-04 DIAGNOSIS — Z09 Encounter for follow-up examination after completed treatment for conditions other than malignant neoplasm: Secondary | ICD-10-CM

## 2020-01-04 DIAGNOSIS — J9859 Other diseases of mediastinum, not elsewhere classified: Secondary | ICD-10-CM

## 2020-01-04 DIAGNOSIS — R918 Other nonspecific abnormal finding of lung field: Secondary | ICD-10-CM

## 2020-01-04 NOTE — Progress Notes (Signed)
[address]       [place], [address]             [phone]      Esteen D Washabaugh [place] Medical Record #[id number] Date of Birth: 10-Feb-1971  Referring: Freddi Starr, MD Primary Care: Patient, No Pcp Per Primary Cardiologist: No primary care provider on file.   Chief Complaint:   POST OP FOLLOW UP OPERATIVE REPORT DATE OF PROCEDURE:  12/19/2019 PREOPERATIVE DIAGNOSIS:  Large left chest mass with involvement of T2 nerve root. POSTOPERATIVE DIAGNOSIS:  Large left chest mass with involvement of T2 nerve root, final pathology pending, clinically suspicious for schwannoma. PROCEDURE PERFORMED:   1.  Bronchoscopy.   2.  Left video-assisted thoracoscopy with resection of posterior superior mediastinal tumor.   3.  Intercostal nerve block with Exparel.  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 THORACIC TWO TUMOR, LEFT, EXCISION:  - Benign schwannoma.  B. INTRAMUSCULAR THORACIC LESION, EXCISION:  - Benign schwannoma.  C. MEDIASTINAL MASS, PARTIAL POSTERIOR, RESECTION:  - Benign schwannoma.  D. MEDIASTINAL MASS, PARTIAL POSTERIOR, RESECTION:  - Benign schwannoma.   History of Present Illness:     Patient returns to the office today in follow-up after her recent combined laminectomy and video-assisted left thoracoscopy with resection of large posterior mediastinal tumor.  Final path confirmed benign schwannoma.  The patient is doing relatively well since discharge she has had incisional pain especially in the neck and some discomfort involving the posterior aspect of the left arm.  He is ambulating without difficulty.  Respiratory status has been stable.     Past Medical History:  Diagnosis Date  . Anxiety   . Dyspnea    on exertion  . Hypertension      Social History    Tobacco Use  Smoking Status Passive Smoke Exposure - Never Smoker  Smokeless Tobacco Never Used  Tobacco Comment   mother smokes- is frequently around mother.     Social History   Substance and Sexual Activity  Alcohol Use No  . Alcohol/week: 0.0 standard drinks     No Known Allergies  Current Outpatient Medications  Medication Sig Dispense Refill  . albuterol (VENTOLIN HFA) 108 (90 Base) MCG/ACT inhaler Inhale 1-2 puffs into the lungs every 6 (six) hours as needed for wheezing or shortness of breath. 8 g 2  . amLODipine (NORVASC) 10 MG tablet Take 1 tablet (10 mg total) by mouth daily. 30 tablet 1  . ferrous [id number] C-folic acid (TRINSICON / FOLTRIN) capsule Take 1 capsule by mouth 2 (two) times daily after a meal. 60 capsule 1  . FLUoxetine (PROZAC) 10 MG tablet Take 1 tablet (10 mg total) by mouth daily. 90 tablet 1  . hydrochlorothiazide (HYDRODIURIL) 12.5 MG tablet Take 1 tablet (12.5 mg total) by mouth daily. 90 tablet 0  . lisinopril (ZESTRIL) 20 MG tablet Take 1 tablet (20 mg total) by mouth daily. 30 tablet 1  . Multiple Vitamin (MULTIVITAMIN WITH MINERALS) TABS tablet Take 1 tablet by mouth daily.    Marland Kitchen oxyCODONE (OXY IR/ROXICODONE) 5 MG immediate release tablet Take 1 tablet (5 mg total) by mouth every 6 (six) hours as needed for up to 7 days for severe pain. 28 tablet 0   No current facility-administered medications for this visit.  Physical Exam: BP 105/75   Pulse 90   Temp 97.8 F (36.6 C) (Skin)   Resp 20   Ht 5\' 6"  (1.676 m)   Wt 205 lb (93 kg)   SpO2 96% Comment: RA  BMI 33.09 kg/m   General appearance: alert, cooperative and no distress Neurologic: intact Heart: regular rate and rhythm, S1, S2 normal, no murmur, click, rub or gallop Lungs: clear to auscultation bilaterally Abdomen: soft, non-tender; bowel sounds normal; no masses,  no organomegaly Extremities: extremities normal, atraumatic, no cyanosis or edema and Homans sign  is negative, no sign of DVT Wound: Dressings in place over the laminectomy incision the left VATS port incisions are all healing well without drainage or infection   Diagnostic Studies & Laboratory data:     Recent Radiology Findings:  Narrative & Impression  CLINICAL DATA:  Recent VATS procedure for left-sided pulmonary mass  EXAM: CHEST - 2 VIEW  COMPARISON:  December 22, 2019 chest radiograph and previous PET-CT November 07, 2019  FINDINGS: There is ill-defined increased opacity in the medial left upper lobe which likely represents postoperative scarring and/or hemorrhage; a small amount of residual mass in this area cannot be excluded by radiography. Lungs elsewhere are clear. Heart is upper normal in size with pulmonary vascularity normal. No adenopathy. No bone lesions.  IMPRESSION: The opacity in the left upper lobe medially is smaller, likely due to resolving postoperative hemorrhage. There may well be scarring in this area. A small amount of residual mass in this area cannot be entirely excluded by radiography. Elsewhere lungs are clear. Heart upper normal in size. No adenopathy evident.   Electronically Signed   By: Lowella Grip III M.D.   On: 01/04/2020 15:10   I have independently reviewed the above radiology studies  and reviewed the findings with the patient.  I have personally reviewed the above x-ray and do not agree with the overall radiographic reading, there is very faint opacity left upper lobe medially-no evidence of recurrent tumor or hemorrhage.  Film appears to be a very satisfactory postop chest x-ray    Recent Lab Findings: Lab Results  Component Value Date   WBC 8.9 12/21/2019   HGB 9.2 (L) 12/21/2019   HCT 27.3 (L) 12/21/2019   PLT 180 12/21/2019   GLUCOSE 104 (H) 12/21/2019   ALT 11 12/21/2019   AST 30 12/21/2019   NA 144 12/21/2019   K 3.5 12/21/2019   CL 109 12/21/2019   CREATININE 0.95 12/21/2019   BUN 7 12/21/2019   CO2  29 12/21/2019   INR 1.0 12/15/2019      Assessment / Plan:   #1 patient stable following recent resection of a large posterior mediastinal mass which on final pathology was confirmed as a benign schwannoma-the mass was resected as a combined procedure with neurosurgery performed laminectomy and then intrathoracic resection of the mediastinal mass.  Patient is making good progress postoperatively, she is to see neurosurgery early next week.  We'll plan to see her back in 3 to 4 weeks with follow-up chest x-ray.  She will continue to increase her physical activity as appropriate, but avoid heavy lifting over 20 pounds.   Medication Changes: No orders of the defined types were placed in this encounter.     Grace Isaac MD      [address] [place] [address] Office [phone]     01/08/2020 1:02 PM

## 2020-01-08 ENCOUNTER — Ambulatory Visit: Payer: Self-pay | Attending: Family | Admitting: Family

## 2020-01-08 ENCOUNTER — Other Ambulatory Visit: Payer: Self-pay

## 2020-01-08 DIAGNOSIS — Z09 Encounter for follow-up examination after completed treatment for conditions other than malignant neoplasm: Secondary | ICD-10-CM

## 2020-01-08 DIAGNOSIS — I10 Essential (primary) hypertension: Secondary | ICD-10-CM

## 2020-01-08 DIAGNOSIS — Z7689 Persons encountering health services in other specified circumstances: Secondary | ICD-10-CM

## 2020-01-08 NOTE — Progress Notes (Signed)
Virtual Visit via Telephone Note  I connected with Brittany Morgan, on 01/08/2020 at 8:53 AMby telephone due to the COVID-19 pandemic and verified that I am speaking with the correct person using two identifiers.  Due to current restrictions/limitations of in-office visits due to the COVID-19 pandemic, this scheduled clinical appointment was converted to a telehealth visit.   Consent: I discussed the limitations, risks, security and privacy concerns of performing an evaluation and management service by telephone and the availability of in person appointments. I also discussed with the patient that there may be a patient responsible charge related to this service. The patient expressed understanding and agreed to proceed.  Location of Patient: Home  Location of Provider: [facility]  Persons participating in Telemedicine visit: Brittany Morgan Durene Fruits, [place], [place]  History of Present Illness: Brittany Morgan is a 49 year-old female with history of hypertensive disorder, depressive disorder, hyperlipidemia, Schwannoma of nerve of chest, and chest mass who presents for hospital follow-up.  1. HOSPITAL FOLLOW UP: Time since discharge: 17 days Hospital/facility: [facility] Diagnosis: mass in chest (pathology shown to be a benign Schwannoma) Procedures/tests: left T2 laminectomy with facetectomy, transpedicular approach for resection of tumor; resection of intramuscular thoracic tumor; use of microscope for microdissection; MR thoracic spine w/ wo/ contrast, NM PET image initial (PI) skull base to thigh Consultants: Neurosurgery, Cardiothoracic Surgery New medications: Oxycodone, Lisinopril (dosage increased), Hydrochlorothiazide, Amlodipine, Ferrous Fumarate Discharge instructions: follow-up with Cardiothoracic Surgery 12/27/2019 for suture removal, Neurosurgery in 2 weeks, [place] Status: better   Says  she is still having pain at surgical sites, 7-8/10 without pain medication. Oxycodone helping and brings pain to 3/10. Pain is constant and worse with movement. Says saw Cardiothoracic Surgery on 01/04/2020 and was told incision on the side looks good, staple and drain removed, currently no dressing, still sutures on inside, no bleeding. During the same appointment was told back incision, looks good, still has a dressing, and plans to follow-up with Neurosurgery on tomorrow for back incision. Follow-up with Cardiothoracic Surgery in 1 month. No longer using Albuterol as she hasn't needed it.   2. HYPERTENSION FOLLOW-UP: Currently taking: see medication list Med Adherence: [x]  Yes    []  No Medication side effects: []  Yes    [x]  No  Adherence with salt restriction: []  Yes    [x]  No Home Monitoring?: []  Yes    [x]  No Monitoring Frequency: []  Yes    [x]  No Home BP results range: []  Yes    [x]  No Smoking []  Yes [x]  No  SOB? []  Yes    [x]  No  Chest Pain?: []  Yes    [x]  No Leg swelling?: []  Yes    [x]  No Headaches?: []  Yes    [x]  No Dizziness? []  Yes    [x]  No  2. ESTABLISH CARE: PRESENT ILLNESS:   Medications: Albuterol, Amlodipine, Ferrous Fumarate, Fluoxetine, Hydrochlorothiazide, Lisinopril, Multiple Vitamin, Oxycodone  Allergies: none  Alcohol use: on occasion   Smoking: denies  PAST HISTORY:   Childhood Illnesses: none  Medical History: anxiety, dyspnea with exertion, hypertension   Surgical History: laminectomy left 12/19/2019; video bronchoscopy 12/19/2019; video assisted thoracoscopy (vats)/wedge resection left; appendectomy  Obstetric/Gynecology: 3 pregnancies all vaginal delivery; LMP: 1 year ago  Mental health history: anxiety, depression   Health Maintenance: PAP smear about 2 years ago; mammogram October 2021 has fibro benign masses; no flu vaccine yet, had both Pfizer vaccines around April  2021 and no booster yet  FAMILY HISTORY:   Mother: hyperlipidemia, heart  disease  Father: stroke, hypertension   Son: childhood cancers   PERSONAL AND SOCIAL HISTORY:  Occupation: housewife   Last Year of Schooling: business college   Home Situation: husband, mother in Sports coach, son   Stress: denies   Special Religous/Spiritual Beliefs: none   Exercise: sometimes   Diet: homecooked meals chicken, veggies, beef, pork      Past Medical History:  Diagnosis Date  . Anxiety   . Dyspnea    on exertion  . Hypertension    No Known Allergies  Current Outpatient Medications on File Prior to Visit  Medication Sig Dispense Refill  . albuterol (VENTOLIN HFA) 108 (90 Base) MCG/ACT inhaler Inhale 1-2 puffs into the lungs every 6 (six) hours as needed for wheezing or shortness of breath. 8 g 2  . amLODipine (NORVASC) 10 MG tablet Take 1 tablet (10 mg total) by mouth daily. 30 tablet 1  . ferrous [id number] C-folic acid (TRINSICON / FOLTRIN) capsule Take 1 capsule by mouth 2 (two) times daily after a meal. 60 capsule 1  . FLUoxetine (PROZAC) 10 MG tablet Take 1 tablet (10 mg total) by mouth daily. 90 tablet 1  . hydrochlorothiazide (HYDRODIURIL) 12.5 MG tablet Take 1 tablet (12.5 mg total) by mouth daily. 90 tablet 0  . lisinopril (ZESTRIL) 20 MG tablet Take 1 tablet (20 mg total) by mouth daily. 30 tablet 1  . Multiple Vitamin (MULTIVITAMIN WITH MINERALS) TABS tablet Take 1 tablet by mouth daily.    Marland Kitchen oxyCODONE (OXY IR/ROXICODONE) 5 MG immediate release tablet Take 1 tablet (5 mg total) by mouth every 6 (six) hours as needed for up to 7 days for severe pain. 28 tablet 0  . [DISCONTINUED] lisinopril-hydrochlorothiazide (PRINZIDE,ZESTORETIC) 20-12.5 MG tablet TAKE ONE TABLET BY MOUTH ONCE DAILY. (Patient not taking: Reported on 10/27/2019) 30 tablet 2   No current facility-administered medications on file prior to visit.    Observations/Objective: Alert and oriented x 3. Not in acute distress. Physical examination not completed as this is a telemedicine  visit.  Assessment and Plan: 1. Hospital discharge follow-up: - Status: feeling better but still having some pain. - Continue Oxycodone as prescribed at hospital discharge.  - Keep appointments with Cardiothoracic Surgery.  - Keep appointments with Neurosurgery.  - Patient was given clear instructions to go to Emergency Department or return to medical center if symptoms worsen, or new problems develop.The patient verbalized understanding.  2. Encounter to establish care: - New patient health history obtained today.  - Make an appointment within 1 month with primary physician for physical examination. - Make an appointment sooner if needed for any other concerns. Patient verbalized understanding.   3. Essential hypertension: - Continue Lisinopril, Hydrochlorothiazide, and Amlodipine as prescribed.  - Counseled on blood pressure goal of less than 130/80, low-sodium, DASH diet, medication compliance, 150 minutes of moderate intensity exercise per week as tolerated. Discussed medication compliance, adverse effects. - Will discuss at meeting with primary physician on next month or sooner if needed.   Follow Up Instructions: Keep appointments with Cardiothoracic Surgery and Neurosurgery. Make appointment to meet with primary physician in 1 month or sooner if needed.   Patient was given clear instructions to go to Emergency Department or return to medical center if symptoms don't improve, worsen, or new problems develop.The patient verbalized understanding.  I discussed the assessment and treatment plan with the patient. The patient was provided an opportunity to  ask questions and all were answered. The patient agreed with the plan and demonstrated an understanding of the instructions.   The patient was advised to call back or seek an in-person evaluation if the symptoms worsen or if the condition fails to improve as anticipated.   I provided 20 minutes total of non-face-to-face time during  this encounter including median intraservice time, reviewing previous notes, labs, imaging, medications, management and patient verbalized understanding.    Camillia Herter, NP  [facility] [place], [place]   01/08/2020, 8:06 AM

## 2020-01-12 ENCOUNTER — Other Ambulatory Visit: Payer: Self-pay | Admitting: *Deleted

## 2020-01-12 MED ORDER — TRAMADOL HCL 50 MG PO TABS: 50.0000 mg | ORAL_TABLET | Freq: Four times a day (QID) | ORAL | 0 refills | Status: AC | PRN

## 2020-01-12 NOTE — Progress Notes (Signed)
Brittany Morgan called stating she is still experiencing pain s/p VATs for mediastinal mass resection and thoracic laminectomy 12/19/19. States pain occurs in left chest along with right shoulder and neck area. Pt states she is taking Oxycodone at night to help her sleep and Tylenol throughout the day but it doesn't help. Pt encouraged to continue taking Tylenol as needed and that she needs to begin to wean herself off pain medication. In the meantime, W. Gold, PA, was consulted and a prescription for Tramadol 50mg  PO [id number] was called into pt's preferred pharmacy [facility] off [address]. Pt accepts receipt.

## 2020-01-30 ENCOUNTER — Other Ambulatory Visit: Payer: Self-pay | Admitting: Cardiothoracic Surgery

## 2020-01-30 DIAGNOSIS — R222 Localized swelling, mass and lump, trunk: Secondary | ICD-10-CM

## 2020-02-01 ENCOUNTER — Other Ambulatory Visit: Payer: Self-pay

## 2020-02-01 ENCOUNTER — Ambulatory Visit
Admission: RE | Admit: 2020-02-01 | Discharge: 2020-02-01 | Disposition: A | Discharge status: Home / Self Care | Payer: Self-pay | Source: Ambulatory Visit | Attending: Cardiothoracic Surgery | Admitting: Cardiothoracic Surgery

## 2020-02-01 ENCOUNTER — Ambulatory Visit (INDEPENDENT_AMBULATORY_CARE_PROVIDER_SITE_OTHER): Payer: Self-pay | Admitting: Cardiothoracic Surgery

## 2020-02-01 ENCOUNTER — Encounter: Payer: Self-pay | Admitting: Cardiothoracic Surgery

## 2020-02-01 VITALS — BP 127/85 | HR 80 | Temp 98.4°F | Resp 20 | Ht 66.0 in | Wt 206.6 lb

## 2020-02-01 DIAGNOSIS — R222 Localized swelling, mass and lump, trunk: Secondary | ICD-10-CM

## 2020-02-01 MED ORDER — GABAPENTIN 300 MG PO CAPS: 300.0000 mg | ORAL_CAPSULE | Freq: Two times a day (BID) | ORAL | 0 refills | Status: DC

## 2020-02-01 NOTE — Progress Notes (Signed)
[address]       [place] [address]             [phone]      Brittany Morgan  Medical Record #[id number] Date of Birth: December 14, 1970  Referring: Freddi Starr, MD Primary Care: Patient, No Pcp Per Primary Cardiologist: No primary care provider on file.   Chief Complaint:   POST OP FOLLOW UP OPERATIVE REPORT DATE OF PROCEDURE:  12/19/2019 PREOPERATIVE DIAGNOSIS:  Large left chest mass with involvement of T2 nerve root. POSTOPERATIVE DIAGNOSIS:  Large left chest mass with involvement of T2 nerve root, final pathology pending, clinically suspicious for schwannoma. PROCEDURE PERFORMED:   1.  Bronchoscopy.   2.  Left video-assisted thoracoscopy with resection of posterior superior mediastinal tumor.   3.  Intercostal nerve block with Exparel.  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 THORACIC TWO TUMOR, LEFT, EXCISION:  - Benign schwannoma.  B. INTRAMUSCULAR THORACIC LESION, EXCISION:  - Benign schwannoma.  C. MEDIASTINAL MASS, PARTIAL POSTERIOR, RESECTION:  - Benign schwannoma.  D. MEDIASTINAL MASS, PARTIAL POSTERIOR, RESECTION:  - Benign schwannoma.   History of Present Illness:     Patient returns to the office today in follow-up after her recent combined laminectomy and video-assisted left thoracoscopy with resection of large posterior mediastinal tumor.  Final path confirmed benign schwannoma.  Patient has returned to near normal activities. Her only complaint is some neuropathic type numbness and discomfort along with inner surface of her left upper arm extending down to the lower arm. She has good mobility of her shoulder and arm without weakness.    Past Medical History:  Diagnosis Date  . Anxiety   . Dyspnea    on exertion  . Hypertension      Social  History   Tobacco Use  Smoking Status Passive Smoke Exposure - Never Smoker  Smokeless Tobacco Never Used  Tobacco Comment   mother smokes- is frequently around mother.     Social History   Substance and Sexual Activity  Alcohol Use No  . Alcohol/week: 0.0 standard drinks     No Known Allergies  Current Outpatient Medications  Medication Sig Dispense Refill  . albuterol (VENTOLIN HFA) 108 (90 Base) MCG/ACT inhaler Inhale 1-2 puffs into the lungs every 6 (six) hours as needed for wheezing or shortness of breath. 8 g 2  . amLODipine (NORVASC) 10 MG tablet Take 1 tablet (10 mg total) by mouth daily. 30 tablet 1  . ferrous [id number] C-folic acid (TRINSICON / FOLTRIN) capsule Take 1 capsule by mouth 2 (two) times daily after a meal. 60 capsule 1  . FLUoxetine (PROZAC) 10 MG tablet Take 1 tablet (10 mg total) by mouth daily. 90 tablet 1  . hydrochlorothiazide (HYDRODIURIL) 12.5 MG tablet Take 1 tablet (12.5 mg total) by mouth daily. 90 tablet 0  . lisinopril (ZESTRIL) 20 MG tablet Take 1 tablet (20 mg total) by mouth daily. 30 tablet 1  . Multiple Vitamin (MULTIVITAMIN WITH MINERALS) TABS tablet Take 1 tablet by mouth daily.    Marland Kitchen gabapentin (NEURONTIN) 300 MG capsule Take 1 capsule (300 mg total) by mouth 2 (two) times daily. 90 capsule 0   No current facility-administered medications for this visit.       Physical Exam: BP 127/85 (BP Location: Right Arm,  Patient Position: Sitting, Cuff Size: Large)   Pulse 80   Temp 98.4 F (36.9 C) (Skin)   Resp 20   Ht 5\' 6"  (1.676 m)   Wt 206 lb 9.6 oz (93.7 kg)   SpO2 97% Comment: RA  BMI 33.35 kg/m  General appearance: alert and cooperative Neck: no adenopathy, no carotid bruit, no JVD, supple, symmetrical, trachea midline and thyroid not enlarged, symmetric, no tenderness/mass/nodules Back: symmetric, no curvature. ROM normal. No CVA tenderness., Upper back neck incision posteriorly is healing well Cardio: regular rate and  rhythm, S1, S2 normal, no murmur, click, rub or gallop Extremities: extremities normal, atraumatic, no cyanosis or edema and Homans sign is negative, no sign of DVT Neurologic: Grossly normal  Diagnostic Studies & Laboratory data:     Recent Radiology Findings:  DG Chest 2 View  Result Date: 02/01/2020 CLINICAL DATA:  Follow-up left sided VATS procedure and resection of mediastinal mass from 12/19/2019. Patient complains of left arm pain. EXAM: CHEST - 2 VIEW COMPARISON:  01/04/2020 FINDINGS: Mild haziness in the medial left upper chest is similar to the comparison examination and likely represents postoperative changes. Otherwise, the lungs are clear. Heart size is stable. The trachea is midline. No pleural effusions. Again noted is mild kyphosis near the thoracolumbar spine. Left apical thickening is unchanged from the prior examination. Densities along the anterior aspect of the right seventh rib likely represent overlying structures. IMPRESSION: Stable postsurgical changes following removal of the mediastinal mass. No acute chest findings. Electronically Signed   By: Markus Daft M.D.   On: 02/01/2020 10:39   Narrative & Impression  CLINICAL DATA:  Recent VATS procedure for left-sided pulmonary mass  EXAM: CHEST - 2 VIEW  COMPARISON:  December 22, 2019 chest radiograph and previous PET-CT November 07, 2019  FINDINGS: There is ill-defined increased opacity in the medial left upper lobe which likely represents postoperative scarring and/or hemorrhage; a small amount of residual mass in this area cannot be excluded by radiography. Lungs elsewhere are clear. Heart is upper normal in size with pulmonary vascularity normal. No adenopathy. No bone lesions.  IMPRESSION: The opacity in the left upper lobe medially is smaller, likely due to resolving postoperative hemorrhage. There may well be scarring in this area. A small amount of residual mass in this area cannot be entirely excluded by  radiography. Elsewhere lungs are clear. Heart upper normal in size. No adenopathy evident.   Electronically Signed   By: Lowella Grip III M.D.   On: 01/04/2020 15:10     Recent Lab Findings: Lab Results  Component Value Date   WBC 8.9 12/21/2019   HGB 9.2 (L) 12/21/2019   HCT 27.3 (L) 12/21/2019   PLT 180 12/21/2019   GLUCOSE 104 (H) 12/21/2019   ALT 11 12/21/2019   AST 30 12/21/2019   NA 144 12/21/2019   K 3.5 12/21/2019   CL 109 12/21/2019   CREATININE 0.95 12/21/2019   BUN 7 12/21/2019   CO2 29 12/21/2019   INR 1.0 12/15/2019      Assessment / Plan:   #1 patient stable following recent resection of a large posterior mediastinal mass which on final pathology was confirmed as a benign schwannoma-the mass was resected as a combined procedure with neurosurgery performed laminectomy and then intrathoracic resection of the mediastinal mass.  With the patient's neuropathic type discomfort in the left arm, we did start low-dose Neurontin to see if there would be any improvement in this.  She has a follow-up  appointment with neurosurgery in the near future  Plan to see her back in 6 weeks.   Medication Changes: Meds ordered this encounter  Medications  . gabapentin (NEURONTIN) 300 MG capsule    Sig: Take 1 capsule (300 mg total) by mouth 2 (two) times daily.    Dispense:  90 capsule    Refill:  0      Grace Isaac MD      [address] [place] [address] Office [phone]     02/01/2020 11:24 AM

## 2020-02-19 ENCOUNTER — Telehealth: Payer: Self-pay

## 2020-02-19 MED ORDER — GABAPENTIN 300 MG PO CAPS
300.0000 mg | ORAL_CAPSULE | Freq: Three times a day (TID) | ORAL | 0 refills | Status: DC
Start: 2020-02-19 — End: 2022-04-08

## 2020-02-19 NOTE — Telephone Encounter (Signed)
-----   Message from Grace Isaac, MD sent at 02/19/2020  1:45 PM EST ----- Regarding: RE: Pain in left arm She went once but was to go back again - reminder to go ----- Message ----- From: Donnella Sham, RN Sent: 02/19/2020   1:43 PM EST To: Grace Isaac, MD Subject: RE: Pain in left arm                           Doesn't look like she has.  According to her discharge paperwork, she was to contact them for a follow-up 2 weeks after discharge.  I don't believe she ever did.  You want me to tell her to make an appointment with them?  ----- Message ----- From: Grace Isaac, MD Sent: 02/19/2020   1:05 PM EST To: Donnella Sham, RN Subject: RE: Pain in left arm                           She can increase neurontin to 300 mg tid, has she had follow up with Neuro surgery ? ----- Message ----- From: Donnella Sham, RN Sent: 02/19/2020  12:28 PM EST To: Grace Isaac, MD Subject: Pain in left arm                               Hey,  She called this morning stating that the Neurontin worked really well for the first 1-2 weeks that she had taken it.  Then the pain started to come back slowly.  She said that she is experiencing something new.  She will take the medication then a couple hours later she will start to have pain and sharp radiating "zing" from her neck to her left arm. She states that the pain is also worse at night.  She was inquiring about increasing the medication.    Thanks,  Caryl Pina

## 2020-02-19 NOTE — Telephone Encounter (Signed)
Patient called back and made aware of change to current Neurontin prescription.  To take 300 mg TID per Dr. Servando Snare.  She also asked if she could get a refill of her Lisinopril.  She currently only has one left.  Advised to contact her PCP at [facility].  She acknowledged receipt.

## 2020-02-21 ENCOUNTER — Ambulatory Visit: Payer: Self-pay | Attending: Physician Assistant | Admitting: Physician Assistant

## 2020-02-21 ENCOUNTER — Encounter: Payer: Self-pay | Admitting: Physician Assistant

## 2020-02-21 ENCOUNTER — Other Ambulatory Visit: Payer: Self-pay

## 2020-02-21 VITALS — BP 137/88 | HR 65 | Resp 18 | Ht 66.0 in | Wt 212.0 lb

## 2020-02-21 DIAGNOSIS — E785 Hyperlipidemia, unspecified: Secondary | ICD-10-CM

## 2020-02-21 DIAGNOSIS — D3614 Benign neoplasm of peripheral nerves and autonomic nervous system of thorax: Secondary | ICD-10-CM

## 2020-02-21 DIAGNOSIS — I10 Essential (primary) hypertension: Secondary | ICD-10-CM

## 2020-02-21 DIAGNOSIS — F32A Depression, unspecified: Secondary | ICD-10-CM

## 2020-02-21 DIAGNOSIS — H02401 Unspecified ptosis of right eyelid: Secondary | ICD-10-CM

## 2020-02-21 DIAGNOSIS — Z114 Encounter for screening for human immunodeficiency virus [HIV]: Secondary | ICD-10-CM

## 2020-02-21 DIAGNOSIS — R0602 Shortness of breath: Secondary | ICD-10-CM

## 2020-02-21 DIAGNOSIS — Z1159 Encounter for screening for other viral diseases: Secondary | ICD-10-CM

## 2020-02-21 DIAGNOSIS — F329 Major depressive disorder, single episode, unspecified: Secondary | ICD-10-CM

## 2020-02-21 MED ORDER — HYDROCHLOROTHIAZIDE 12.5 MG PO TABS: 12.5000 mg | ORAL_TABLET | Freq: Every day | ORAL | 0 refills | Status: DC

## 2020-02-21 MED ORDER — AMLODIPINE BESYLATE 10 MG PO TABS: 10.0000 mg | ORAL_TABLET | Freq: Every day | ORAL | 1 refills | Status: DC

## 2020-02-21 MED ORDER — FLUOXETINE HCL 10 MG PO TABS: 10.0000 mg | ORAL_TABLET | Freq: Every day | ORAL | 1 refills | Status: DC

## 2020-02-21 MED ORDER — LISINOPRIL 20 MG PO TABS: 20.0000 mg | ORAL_TABLET | Freq: Every day | ORAL | 1 refills | Status: DC

## 2020-02-21 MED ORDER — FE FUMARATE-B12-VIT C-FA-IFC PO CAPS: 1.0000 | ORAL_CAPSULE | Freq: Two times a day (BID) | ORAL | 1 refills | Status: AC

## 2020-02-21 MED ORDER — ALBUTEROL SULFATE HFA 108 (90 BASE) MCG/ACT IN AERS: 1.0000 | INHALATION_SPRAY | Freq: Four times a day (QID) | RESPIRATORY_TRACT | 2 refills | Status: AC | PRN

## 2020-02-21 NOTE — Patient Instructions (Signed)
I encourage you to continue the lisinopril 20 mg, keep a written log of your blood pressure and pulse, if blood pressure remains higher than 120/80, I encourage you to restart the hydrochlorothiazide 12.5 mg.  If it is still uncontrolled, I encourage you to restart the amlodipine.  I would restart the hydrochlorothiazide for 1 week prior to restarting the amlodipine.  We will call you with your lab results.  Kennieth Rad, PA-C Physician Assistant [place] [email or web address]   How to Take Your Blood Pressure Blood pressure is a measurement of how strongly your blood is pressing against the walls of your arteries. Arteries are blood vessels that carry blood from your heart throughout your body. Your health care provider takes your blood pressure at each office visit. You can also take your own blood pressure at home with a blood pressure machine. You may need to take your own blood pressure:  To confirm a diagnosis of high blood pressure (hypertension).  To monitor your blood pressure over time.  To make sure your blood pressure medicine is working. Supplies needed: To take your blood pressure, you will need a blood pressure machine. You can buy a blood pressure machine, or blood pressure monitor, at most drugstores or online. There are several types of home blood pressure monitors. When choosing one, consider the following:  Choose a monitor that has an arm cuff.  Choose a cuff that wraps snugly around your upper arm. You should be able to fit only one finger between your arm and the cuff.  Do not choose a monitor that measures your blood pressure from your wrist or finger. Your health care provider can suggest a reliable monitor that will meet your needs. How to prepare To get the most accurate reading, avoid the following for 30 minutes before you check your blood pressure:  Drinking caffeine.  Drinking  alcohol.  Eating.  Smoking.  Exercising. Five minutes before you check your blood pressure:  Empty your bladder.  Sit quietly without talking in a dining chair, rather than in a soft couch or armchair. How to take your blood pressure To check your blood pressure, follow the instructions in the manual that came with your blood pressure monitor. If you have a digital blood pressure monitor, the instructions may be as follows: 1. Sit up straight. 2. Place your feet on the floor. Do not cross your ankles or legs. 3. Rest your left arm at the level of your heart on a table or desk or on the arm of a chair. 4. Pull up your shirt sleeve. 5. Wrap the blood pressure cuff around the upper part of your left arm, 1 inch (2.5 cm) above your elbow. It is best to wrap the cuff around bare skin. 6. Fit the cuff snugly around your arm. You should be able to place only one finger between the cuff and your arm. 7. Position the cord inside the groove of your elbow. 8. Press the power button. 9. Sit quietly while the cuff inflates and deflates. 10. Read the digital reading on the monitor screen and write it down (record it). 11. Wait 2-3 minutes, then repeat the steps, starting at step 1. What does my blood pressure reading mean? A blood pressure reading consists of a higher number over a lower number. Ideally, your blood pressure should be below 120/80. The first ("top") number is called the systolic pressure. It is a measure of the pressure in your arteries as your heart beats.  The second ("bottom") number is called the diastolic pressure. It is a measure of the pressure in your arteries as the heart relaxes. Blood pressure is classified into four stages. The following are the stages for adults who do not have a short-term serious illness or a chronic condition. Systolic pressure and diastolic pressure are measured in a unit called mm Hg. Normal  Systolic pressure: below 616.  Diastolic pressure: below  80. Elevated  Systolic pressure: 073-710.  Diastolic pressure: below 80. Hypertension stage 1  Systolic pressure: 626-948.  Diastolic pressure: 54-62. Hypertension stage 2  Systolic pressure: 703 or above.  Diastolic pressure: 90 or above. You can have prehypertension or hypertension even if only the systolic or only the diastolic number in your reading is higher than normal. Follow these instructions at home:  Check your blood pressure as often as recommended by your health care provider.  Take your monitor to the next appointment with your health care provider to make sure: ? That you are using it correctly. ? That it provides accurate readings.  Be sure you understand what your goal blood pressure numbers are.  Tell your health care provider if you are having any side effects from blood pressure medicine. Contact a health care provider if:  Your blood pressure is consistently high. Get help right away if:  Your systolic blood pressure is higher than 180.  Your diastolic blood pressure is higher than 110. This information is not intended to replace advice given to you by your health care provider. Make sure you discuss any questions you have with your health care provider. Document Revised: 02/12/2017 Document Reviewed: 08/09/2015 Elsevier Patient Education  2020 [employer].

## 2020-02-21 NOTE — Progress Notes (Signed)
Patient complains of right side tingling from surgery. Patient has taken medication today and patient has not eaten today. Patient needs refill on BP medications

## 2020-02-21 NOTE — Progress Notes (Signed)
Established Patient Office Visit  Subjective:  Patient ID: Brittany Morgan, female    DOB: June 14, 1970  Age: 49 y.o. MRN: [id number]  CC:  Chief Complaint  Patient presents with   Medication Refill    HPI Brittany Morgan reports that she has been having some tingling on her incision area, having right eye drooping and requests refills of her blood pressure medication.  Reports that she had surgery at the beginning of October that involved both neurosurgery and cardiothoracic surgery.  Reports that she has had follow-up with surgeons, was started on gabapentin, and it was recently increased to help with residual nerve pain.  Reports that she is continuing her follow-up with them.  Reports that she feels the right eye droop is also due to the surgery, was not present immediately after surgery, denies any change in vision or eye pain.  Reports that she has been out of her blood pressure medications with the exception of lisinopril, states that her blood pressure became elevated after surgery and reports it was difficult to control so amlodipine 10 mg was added.  She reports that she also was taking hydrochlorothiazide 12.5 mg.  Reports that she has been checking her blood pressure on a daily basis and with lisinopril 20 mg by itself she is getting readings consistent to her blood pressure today in the office which is 137/88.  Reports that she will still occasionally have some shortness of breath, states it is improving since her surgery, states that she will use the albuterol inhaler with relief.   Past Medical History:  Diagnosis Date   Anxiety    Dyspnea    on exertion   Hypertension     Past Surgical History:  Procedure Laterality Date   APPENDECTOMY     LAMINECTOMY Left 12/19/2019   Procedure: THORACIC LAMINECTOMY FOR TUMOR, LEFT THORACIC TWO- THORACIC THREE;  Surgeon: [name], MD;  Location: [facility];  Service: Neurosurgery;  Laterality: Left;   VIDEO ASSISTED  THORACOSCOPY (VATS)/WEDGE RESECTION Left 12/19/2019   Procedure: VIDEO ASSISTED THORACOSCOPY (VATS)/RESECTION POSTERIOR MEDIASTINAL MASS WITH INTERCOSTAL NERVE BLOCK;  Surgeon: Grace Isaac, MD;  Location: [facility];  Service: Thoracic;  Laterality: Left;   VIDEO BRONCHOSCOPY N/A 12/19/2019   Procedure: VIDEO BRONCHOSCOPY;  Surgeon: Grace Isaac, MD;  Location: [facility] OR;  Service: Thoracic;  Laterality: N/A;    Family History  Problem Relation Age of Onset   Hyperlipidemia Mother    Heart disease Mother        AMI; s/p stenting   Stroke Father 86   Hypertension Father     Social History   Socioeconomic History   Marital status: Married    Spouse name: Not on file   Number of children: Not on file   Years of education: Not on file   Highest education level: Not on file  Occupational History   Not on file  Tobacco Use   Smoking status: Passive Smoke Exposure - Never Smoker   Smokeless tobacco: Never Used   Tobacco comment: mother smokes- is frequently around mother.   Substance and Sexual Activity   Alcohol use: No    Alcohol/week: 0.0 standard drinks   Drug use: No   Sexual activity: Yes    Birth control/protection: Surgical    Comment: Husband vasectomy  Other Topics Concern   Not on file  Social History Narrative   Marital status: married      Children: 3 children (24, 15, 1); no grandchildren.  Employment:  Environmental consultant.      Tobacco:      Alcohol:      Exercise:         Social Determinants of Radio broadcast assistant Strain: Not on file  Food Insecurity: Not on file  Transportation Needs: Not on file  Physical Activity: Not on file  Stress: Not on file  Social Connections: Not on file  Intimate Partner Violence: Not on file    Outpatient Medications Prior to Visit  Medication Sig Dispense Refill   gabapentin (NEURONTIN) 300 MG capsule Take 1 capsule (300 mg total) by mouth 3 (three) times daily. 90 capsule 0   Multiple  Vitamin (MULTIVITAMIN WITH MINERALS) TABS tablet Take 1 tablet by mouth daily.     albuterol (VENTOLIN HFA) 108 (90 Base) MCG/ACT inhaler Inhale 1-2 puffs into the lungs every 6 (six) hours as needed for wheezing or shortness of breath. 8 g 2   amLODipine (NORVASC) 10 MG tablet Take 1 tablet (10 mg total) by mouth daily. 30 tablet 1   ferrous [id number] C-folic acid (TRINSICON / FOLTRIN) capsule Take 1 capsule by mouth 2 (two) times daily after a meal. 60 capsule 1   FLUoxetine (PROZAC) 10 MG tablet Take 1 tablet (10 mg total) by mouth daily. 90 tablet 1   hydrochlorothiazide (HYDRODIURIL) 12.5 MG tablet Take 1 tablet (12.5 mg total) by mouth daily. 90 tablet 0   lisinopril (ZESTRIL) 20 MG tablet Take 1 tablet (20 mg total) by mouth daily. 30 tablet 1   No facility-administered medications prior to visit.    No Known Allergies  ROS Review of Systems    Objective:    Physical Exam  BP 137/88 (BP Location: Right Arm, Patient Position: Sitting, Cuff Size: Large)    Pulse 65    Resp 18    Ht 5\' 6"  (1.676 m)    Wt 212 lb (96.2 kg)    SpO2 98%    BMI 34.22 kg/m  Wt Readings from Last 3 Encounters:  02/21/20 212 lb (96.2 kg)  02/01/20 206 lb 9.6 oz (93.7 kg)  01/04/20 205 lb (93 kg)     Health Maintenance Due  Topic Date Due   TETANUS/TDAP  Never done   PAP SMEAR-Modifier  Never done   MAMMOGRAM  Never done   COLONOSCOPY  Never done   INFLUENZA VACCINE  Never done   COVID-19 Vaccine (3 - Booster for Pfizer series) 12/27/2019    There are no preventive care reminders to display for this patient.  Lab Results  Component Value Date   TSH 0.721 02/21/2020   Lab Results  Component Value Date   WBC 6.8 02/21/2020   HGB 12.1 02/21/2020   HCT 36.2 02/21/2020   MCV 91 02/21/2020   PLT 273 02/21/2020   Lab Results  Component Value Date   NA 145 (H) 02/21/2020   K 4.3 02/21/2020   CO2 29 12/21/2019   GLUCOSE 95 02/21/2020   BUN 10 02/21/2020    CREATININE 1.02 (H) 02/21/2020   BILITOT 0.4 02/21/2020   ALKPHOS 58 02/21/2020   AST 12 02/21/2020   ALT 11 12/21/2019   PROT 7.0 02/21/2020   ALBUMIN 4.4 02/21/2020   CALCIUM 9.6 02/21/2020   ANIONGAP 6 12/21/2019   Lab Results  Component Value Date   CHOL 402 (H) 02/21/2020   Lab Results  Component Value Date   HDL 83 02/21/2020   Lab Results  Component Value Date   LDLCALC  301 (H) 02/21/2020   Lab Results  Component Value Date   TRIG 110 02/21/2020   Lab Results  Component Value Date   CHOLHDL 4.8 (H) 02/21/2020   No results found for: HGBA1C    Assessment & Plan:   Problem List Items Addressed This Visit      Cardiovascular and Mediastinum   Essential hypertension - Primary   Relevant Medications   lisinopril (ZESTRIL) 20 MG tablet   amLODipine (NORVASC) 10 MG tablet   hydrochlorothiazide (HYDRODIURIL) 12.5 MG tablet   ferrous [id number] C-folic acid (TRINSICON / FOLTRIN) capsule   Other Relevant Orders   CBC with Differential/Platelet (Completed)   Comp. Metabolic Panel (12) (Completed)   TSH (Completed)   Iron, TIBC and Ferritin Panel (Completed)   Vitamin D, 25-hydroxy (Completed)     Other   Depressive disorder   Relevant Medications   FLUoxetine (PROZAC) 10 MG tablet   Other Relevant Orders   Vitamin D, 25-hydroxy (Completed)   Hyperlipidemia   Relevant Medications   lisinopril (ZESTRIL) 20 MG tablet   amLODipine (NORVASC) 10 MG tablet   hydrochlorothiazide (HYDRODIURIL) 12.5 MG tablet   Other Relevant Orders   Lipid panel (Completed)   Schwannoma of nerve of chest   SOB (shortness of breath)   Relevant Medications   albuterol (VENTOLIN HFA) 108 (90 Base) MCG/ACT inhaler   Drooping eyelid, right    Other Visit Diagnoses    Screening for HIV without presence of risk factors       Relevant Orders   HIV antibody (with reflex) (Completed)   Encounter for HCV screening test for low risk patient       Relevant Orders   HCV Ab  w/Rflx to Verification (Completed)    1. Essential hypertension Encouraged patient to continue lisinopril 20 mg, continue to check blood pressure at home, keep written log, encouraged to add hydrochlorothiazide 12.5 mg if blood pressures are higher than 120/80, encouraged to then add back amlodipine 10 mg if after a week of lisinopril and hydrochlorothiazide blood pressures are still higher than 120/80.  Patient to follow-up with clinical pharmacist in 4 weeks for review.  Will follow up with primary care provider in 2 to 3 months - lisinopril (ZESTRIL) 20 MG tablet; Take 1 tablet (20 mg total) by mouth daily.  Dispense: 30 tablet; Refill: 1 - amLODipine (NORVASC) 10 MG tablet; Take 1 tablet (10 mg total) by mouth daily.  Dispense: 30 tablet; Refill: 1 - hydrochlorothiazide (HYDRODIURIL) 12.5 MG tablet; Take 1 tablet (12.5 mg total) by mouth daily.  Dispense: 90 tablet; Refill: 0 - ferrous [id number] C-folic acid (TRINSICON / FOLTRIN) capsule; Take 1 capsule by mouth 2 (two) times daily after a meal.  Dispense: 60 capsule; Refill: 1 - CBC with Differential/Platelet - Comp. Metabolic Panel (12) - TSH - Iron, TIBC and Ferritin Panel - Vitamin D, 25-hydroxy  2. Schwannoma of nerve of chest / Right Eye Droop Continue follow-up with cardiovascular and neurosurgery  3. Hyperlipidemia, unspecified hyperlipidemia type  - Lipid panel  4. Depressive disorder Continue current regimen - FLUoxetine (PROZAC) 10 MG tablet; Take 1 tablet (10 mg total) by mouth daily.  Dispense: 90 tablet; Refill: 1 - Vitamin D, 25-hydroxy  5. SOB (shortness of breath) Continue current regimen - albuterol (VENTOLIN HFA) 108 (90 Base) MCG/ACT inhaler; Inhale 1-2 puffs into the lungs every 6 (six) hours as needed for wheezing or shortness of breath.  Dispense: 8 g; Refill: 2  6. Screening for HIV without presence  of risk factors  - HIV antibody (with reflex)  7. Encounter for HCV screening test for low  risk patient  - HCV Ab w/Rflx to Verification   I have reviewed the patient's medical history (PMH, PSH, Social History, Family History, Medications, and allergies) , and have been updated if relevant. I spent 30 minutes reviewing chart and  face to face time with patient.    Meds ordered this encounter  Medications   lisinopril (ZESTRIL) 20 MG tablet    Sig: Take 1 tablet (20 mg total) by mouth daily.    Dispense:  30 tablet    Refill:  1    Order Specific Question:   Supervising Provider    Answer:   Asencion Noble E [[id number]]   amLODipine (NORVASC) 10 MG tablet    Sig: Take 1 tablet (10 mg total) by mouth daily.    Dispense:  30 tablet    Refill:  1    Order Specific Question:   Supervising Provider    Answer:   Elsie Stain [[id number]]   hydrochlorothiazide (HYDRODIURIL) 12.5 MG tablet    Sig: Take 1 tablet (12.5 mg total) by mouth daily.    Dispense:  90 tablet    Refill:  0    Order Specific Question:   Supervising Provider    Answer:   Elsie Stain [[id number]]   FLUoxetine (PROZAC) 10 MG tablet    Sig: Take 1 tablet (10 mg total) by mouth daily.    Dispense:  90 tablet    Refill:  1    Order Specific Question:   Supervising Provider    Answer:   Asencion Noble E [[id number]]   ferrous [id number] C-folic acid (TRINSICON / FOLTRIN) capsule    Sig: Take 1 capsule by mouth 2 (two) times daily after a meal.    Dispense:  60 capsule    Refill:  1    May substitute near equivalents    Order Specific Question:   Supervising Provider    Answer:   Elsie Stain [[id number]]   albuterol (VENTOLIN HFA) 108 (90 Base) MCG/ACT inhaler    Sig: Inhale 1-2 puffs into the lungs every 6 (six) hours as needed for wheezing or shortness of breath.    Dispense:  8 g    Refill:  2    Order Specific Question:   Supervising Provider    Answer:   Elsie Stain [[id number]]    Follow-up: No follow-ups on file.    Loraine Grip Mayers, PA-C

## 2020-02-22 ENCOUNTER — Telehealth: Payer: Self-pay | Admitting: *Deleted

## 2020-02-22 DIAGNOSIS — R0602 Shortness of breath: Secondary | ICD-10-CM | POA: Insufficient documentation

## 2020-02-22 DIAGNOSIS — H02401 Unspecified ptosis of right eyelid: Secondary | ICD-10-CM | POA: Insufficient documentation

## 2020-02-22 LAB — CBC WITH DIFFERENTIAL/PLATELET
Basophils Absolute: 0.1 10*3/uL (ref 0.0–0.2)
Basos: 1 %
EOS (ABSOLUTE): 0.2 10*3/uL (ref 0.0–0.4)
Eos: 2 %
Hematocrit: 36.2 % (ref 34.0–46.6)
Hemoglobin: 12.1 g/dL (ref 11.1–15.9)
Immature Grans (Abs): 0 10*3/uL (ref 0.0–0.1)
Immature Granulocytes: 0 %
Lymphocytes Absolute: 2.1 10*3/uL (ref 0.7–3.1)
Lymphs: 31 %
MCH: 30.6 pg (ref 26.6–33.0)
MCHC: 33.4 g/dL (ref 31.5–35.7)
MCV: 91 fL (ref 79–97)
Monocytes Absolute: 0.5 10*3/uL (ref 0.1–0.9)
Monocytes: 7 %
Neutrophils Absolute: 4 10*3/uL (ref 1.4–7.0)
Neutrophils: 59 %
Platelets: 273 10*3/uL (ref 150–450)
RBC: 3.96 x10E6/uL (ref 3.77–5.28)
RDW: 13.3 % (ref 11.7–15.4)
WBC: 6.8 10*3/uL (ref 3.4–10.8)

## 2020-02-22 LAB — HCV INTERPRETATION

## 2020-02-22 LAB — COMP. METABOLIC PANEL (12)
AST: 12 IU/L (ref 0–40)
Albumin/Globulin Ratio: 1.7 (ref 1.2–2.2)
Albumin: 4.4 g/dL (ref 3.8–4.9)
Alkaline Phosphatase: 58 IU/L (ref 44–121)
BUN/Creatinine Ratio: 10 (ref 9–23)
BUN: 10 mg/dL (ref 6–24)
Bilirubin Total: 0.4 mg/dL (ref 0.0–1.2)
Calcium: 9.6 mg/dL (ref 8.7–10.2)
Chloride: 108 mmol/L — ABNORMAL HIGH (ref 96–106)
Creatinine, Ser: 1.02 mg/dL — ABNORMAL HIGH (ref 0.57–1.00)
GFR calc Af Amer: 74 mL/min/{1.73_m2} (ref >59)
GFR calc non Af Amer: 64 mL/min/{1.73_m2} (ref >59)
Globulin, Total: 2.6 g/dL (ref 1.5–4.5)
Glucose: 95 mg/dL (ref 65–99)
Potassium: 4.3 mmol/L (ref 3.5–5.2)
Sodium: 145 mmol/L — ABNORMAL HIGH (ref 134–144)
Total Protein: 7 g/dL (ref 6.0–8.5)

## 2020-02-22 LAB — LIPID PANEL
Chol/HDL Ratio: 4.8 ratio — ABNORMAL HIGH (ref 0.0–4.4)
Cholesterol, Total: 402 mg/dL — ABNORMAL HIGH (ref 100–199)
HDL: 83 mg/dL (ref >39)
LDL Chol Calc (NIH): 301 mg/dL — ABNORMAL HIGH (ref 0–99)
Triglycerides: 110 mg/dL (ref 0–149)
VLDL Cholesterol Cal: 18 mg/dL (ref 5–40)

## 2020-02-22 LAB — IRON,TIBC AND FERRITIN PANEL
Ferritin: 118 ng/mL (ref 15–150)
Iron Saturation: 39 % (ref 15–55)
Iron: 108 ug/dL (ref 27–159)
Total Iron Binding Capacity: 274 ug/dL (ref 250–450)
UIBC: 166 ug/dL (ref 131–425)

## 2020-02-22 LAB — HIV ANTIBODY (ROUTINE TESTING W REFLEX): HIV Screen 4th Generation wRfx: NONREACTIVE

## 2020-02-22 LAB — TSH: TSH: 0.721 u[IU]/mL (ref 0.450–4.500)

## 2020-02-22 LAB — VITAMIN D 25 HYDROXY (VIT D DEFICIENCY, FRACTURES): Vit D, 25-Hydroxy: 20.4 ng/mL — ABNORMAL LOW (ref 30.0–100.0)

## 2020-02-22 LAB — HCV AB W/RFLX TO VERIFICATION: HCV Ab: <0.1 s/co ratio (ref 0.0–0.9)

## 2020-02-22 MED ORDER — ATORVASTATIN CALCIUM 20 MG PO TABS: 20.0000 mg | ORAL_TABLET | Freq: Every day | ORAL | 2 refills | Status: DC

## 2020-02-22 NOTE — Telephone Encounter (Signed)
-----   Message from Kennieth Rad, [place] sent at 02/22/2020  1:13 PM EST ----- Please call patient and let her know that her blood count is back within normal limits, I do encourage her to continue the iron at this time.  Her kidney and liver function are within normal limits as well as her thyroid is within normal limits.  Her screening for hepatitis C and HIV were negative.  Her vitamin D is low, she needs to take 2000 units over-the-counter on a daily basis.  Her cholesterol is elevated, unable to calculate her risk score due to how high her overall cholesterol is.  I do recommend however that she begin a cholesterol medication, I will send this to her pharmacy, and she should have her cholesterol rechecked in approximately 6 months.  It is very important that she follow a low-cholesterol diet.

## 2020-02-22 NOTE — Addendum Note (Signed)
Addended by: Kennieth Rad on: 02/22/2020 01:13 PM   Modules accepted: Orders

## 2020-02-22 NOTE — Telephone Encounter (Signed)
Patient verified DOB Patient is aware of labs being normal. Patient was advised of the elevated cholesterol and needing to adhere to a medication as well as cholesterol dietary changes. Patient is aware of a recheck being completed in 6 months. Patient also advised to continue with iron supplements to keep levels in the normal range.

## 2020-04-29 ENCOUNTER — Other Ambulatory Visit: Payer: Self-pay | Admitting: General Practice

## 2020-04-29 DIAGNOSIS — F32A Depression, unspecified: Secondary | ICD-10-CM

## 2020-04-29 DIAGNOSIS — F329 Major depressive disorder, single episode, unspecified: Secondary | ICD-10-CM

## 2020-04-29 NOTE — Telephone Encounter (Signed)
Copied from [place] [id number]. Topic: Quick Communication - Rx Refill/Question >> Apr 29, 2020  4:19 PM Tessa Lerner A wrote: Medication: FLUoxetine (PROZAC) 10 MG   Has the patient contacted their pharmacy? Yes. Patient was directed to contact PCP  Preferred Pharmacy (with phone number or street name): [place], [place] - [address]  Phone:  [phone]  Agent: Please be advised that RX refills may take up to 3 business days. We ask that you follow-up with your pharmacy.

## 2020-05-01 ENCOUNTER — Other Ambulatory Visit: Payer: Self-pay | Admitting: Cardiothoracic Surgery

## 2020-05-01 DIAGNOSIS — R222 Localized swelling, mass and lump, trunk: Secondary | ICD-10-CM

## 2020-05-02 ENCOUNTER — Ambulatory Visit: Payer: Self-pay | Admitting: Cardiothoracic Surgery

## 2020-05-02 NOTE — Progress Notes (Unsigned)
[address]       [place] [address]             [phone]      Rilei D Martinezgarcia [place] Medical Record #[id number] Date of Birth: 1970-03-28  Referring: Judieth Keens, MD Primary Care: Patient, No Pcp Per Primary Cardiologist: No primary care provider on file.   Chief Complaint:   POST OP FOLLOW UP OPERATIVE REPORT DATE OF PROCEDURE:  12/19/2019 PREOPERATIVE DIAGNOSIS:  Large left chest mass with involvement of T2 nerve root. POSTOPERATIVE DIAGNOSIS:  Large left chest mass with involvement of T2 nerve root, final pathology pending, clinically suspicious for schwannoma. PROCEDURE PERFORMED:   1.  Bronchoscopy.   2.  Left video-assisted thoracoscopy with resection of posterior superior mediastinal tumor.   3.  Intercostal nerve block with Exparel.  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 THORACIC TWO TUMOR, LEFT, EXCISION:  - Benign schwannoma.  B. INTRAMUSCULAR THORACIC LESION, EXCISION:  - Benign schwannoma.  C. MEDIASTINAL MASS, PARTIAL POSTERIOR, RESECTION:  - Benign schwannoma.  D. MEDIASTINAL MASS, PARTIAL POSTERIOR, RESECTION:  - Benign schwannoma.   History of Present Illness:     Patient returns to the office today in follow-up after her recent combined laminectomy and video-assisted left thoracoscopy with resection of large posterior mediastinal tumor.  Final path confirmed benign schwannoma.     Past Medical History:  Diagnosis Date  . Anxiety   . Dyspnea    on exertion  . Hypertension      Social History   Tobacco Use  Smoking Status Passive Smoke Exposure - Never Smoker  Smokeless Tobacco Never Used  Tobacco Comment   mother smokes- is frequently around mother.     Social History   Substance and Sexual Activity  Alcohol Use No  .  Alcohol/week: 0.0 standard drinks     No Known Allergies  Current Outpatient Medications  Medication Sig Dispense Refill  . albuterol (VENTOLIN HFA) 108 (90 Base) MCG/ACT inhaler Inhale 1-2 puffs into the lungs every 6 (six) hours as needed for wheezing or shortness of breath. 8 g 2  . amLODipine (NORVASC) 10 MG tablet Take 1 tablet (10 mg total) by mouth daily. 30 tablet 1  . atorvastatin (LIPITOR) 20 MG tablet Take 1 tablet (20 mg total) by mouth daily. 30 tablet 2  . ferrous [id number] C-folic acid (TRINSICON / FOLTRIN) capsule Take 1 capsule by mouth 2 (two) times daily after a meal. 60 capsule 1  . FLUoxetine (PROZAC) 10 MG tablet Take 1 tablet (10 mg total) by mouth daily. 90 tablet 1  . gabapentin (NEURONTIN) 300 MG capsule Take 1 capsule (300 mg total) by mouth 3 (three) times daily. 90 capsule 0  . hydrochlorothiazide (HYDRODIURIL) 12.5 MG tablet Take 1 tablet (12.5 mg total) by mouth daily. 90 tablet 0  . lisinopril (ZESTRIL) 20 MG tablet Take 1 tablet (20 mg total) by mouth daily. 30 tablet 1  . Multiple Vitamin (MULTIVITAMIN WITH MINERALS) TABS tablet Take 1 tablet by mouth daily.     No current facility-administered medications for this visit.       Physical Exam: There were no vitals taken for this visit. {physical exam:21449}  Diagnostic Studies & Laboratory data:     Recent Radiology Findings:  No results found. Narrative & Impression  CLINICAL DATA:  Recent VATS procedure for left-sided pulmonary mass  EXAM: CHEST - 2 VIEW  COMPARISON:  December 22, 2019 chest radiograph and previous PET-CT November 07, 2019  FINDINGS: There is ill-defined increased opacity in the medial left upper lobe which likely represents postoperative scarring and/or hemorrhage; a small amount of residual mass in this area cannot be excluded by radiography. Lungs elsewhere are clear. Heart is upper normal in size with pulmonary vascularity normal. No adenopathy. No  bone lesions.  IMPRESSION: The opacity in the left upper lobe medially is smaller, likely due to resolving postoperative hemorrhage. There may well be scarring in this area. A small amount of residual mass in this area cannot be entirely excluded by radiography. Elsewhere lungs are clear. Heart upper normal in size. No adenopathy evident.   Electronically Signed   By: Lowella Grip III M.D.   On: 01/04/2020 15:10     Recent Lab Findings: Lab Results  Component Value Date   WBC 6.8 02/21/2020   HGB 12.1 02/21/2020   HCT 36.2 02/21/2020   PLT 273 02/21/2020   GLUCOSE 95 02/21/2020   CHOL 402 (H) 02/21/2020   TRIG 110 02/21/2020   HDL 83 02/21/2020   LDLCALC 301 (H) 02/21/2020   ALT 11 12/21/2019   AST 12 02/21/2020   NA 145 (H) 02/21/2020   K 4.3 02/21/2020   CL 108 (H) 02/21/2020   CREATININE 1.02 (H) 02/21/2020   BUN 10 02/21/2020   CO2 29 12/21/2019   TSH 0.721 02/21/2020   INR 1.0 12/15/2019      Assessment / Plan:   #1 patient stable following recent resection of a large posterior mediastinal mass which on final pathology was confirmed as a benign schwannoma-the mass was resected as a combined procedure with neurosurgery performed laminectomy and then intrathoracic resection of the mediastinal mass.     Medication Changes: No orders of the defined types were placed in this encounter.     Grace Isaac MD      [address] [place], [address] Office [phone]     05/02/2020 9:06 AM

## 2020-05-13 ENCOUNTER — Other Ambulatory Visit: Payer: Self-pay

## 2020-05-13 ENCOUNTER — Telehealth: Payer: Self-pay | Admitting: Nurse Practitioner

## 2020-05-13 ENCOUNTER — Ambulatory Visit: Payer: Self-pay | Attending: Nurse Practitioner | Admitting: Nurse Practitioner

## 2020-05-13 ENCOUNTER — Encounter: Payer: Self-pay | Admitting: Nurse Practitioner

## 2020-05-13 DIAGNOSIS — Z7689 Persons encountering health services in other specified circumstances: Secondary | ICD-10-CM

## 2020-05-13 DIAGNOSIS — I10 Essential (primary) hypertension: Secondary | ICD-10-CM

## 2020-05-13 DIAGNOSIS — E785 Hyperlipidemia, unspecified: Secondary | ICD-10-CM

## 2020-05-13 DIAGNOSIS — F329 Major depressive disorder, single episode, unspecified: Secondary | ICD-10-CM

## 2020-05-13 DIAGNOSIS — F32A Depression, unspecified: Secondary | ICD-10-CM

## 2020-05-13 MED ORDER — HYDROXYZINE HCL 10 MG PO TABS: 10.0000 mg | ORAL_TABLET | Freq: Three times a day (TID) | ORAL | 1 refills | Status: AC | PRN

## 2020-05-13 MED ORDER — FLUOXETINE HCL 20 MG PO TABS: 20.0000 mg | ORAL_TABLET | Freq: Every day | ORAL | 3 refills | Status: DC

## 2020-05-13 MED ORDER — LISINOPRIL 20 MG PO TABS: 20.0000 mg | ORAL_TABLET | Freq: Every day | ORAL | 0 refills | Status: DC

## 2020-05-13 MED ORDER — AMLODIPINE BESYLATE 10 MG PO TABS: 10.0000 mg | ORAL_TABLET | Freq: Every day | ORAL | 0 refills | Status: DC

## 2020-05-13 MED ORDER — HYDROCHLOROTHIAZIDE 12.5 MG PO TABS: 12.5000 mg | ORAL_TABLET | Freq: Every day | ORAL | 0 refills | Status: DC

## 2020-05-13 MED ORDER — ATORVASTATIN CALCIUM 20 MG PO TABS: 20.0000 mg | ORAL_TABLET | Freq: Every day | ORAL | 3 refills | Status: DC

## 2020-05-13 NOTE — Progress Notes (Signed)
Needs refill on medications.  Has pain in upper back and left arm.

## 2020-05-13 NOTE — Progress Notes (Signed)
Virtual Visit via Telephone Note Due to national recommendations of social distancing due to [place] 19, telehealth visit is felt to be most appropriate for this patient at this time.  I discussed the limitations, risks, security and privacy concerns of performing an evaluation and management service by telephone and the availability of in person appointments. I also discussed with the patient that there may be a patient responsible charge related to this service. The patient expressed understanding and agreed to proceed.    I connected with Brittany Morgan on 05/13/20  at   1:50 PM EST  EDT by telephone and verified that I am speaking with the correct person using two identifiers.   Consent I discussed the limitations, risks, security and privacy concerns of performing an evaluation and management service by telephone and the availability of in person appointments. I also discussed with the patient that there may be a patient responsible charge related to this service. The patient expressed understanding and agreed to proceed.   Location of Patient: Private Residence   Location of Provider: [facility] [employer] Office    Persons participating in Telemedicine visit: Geryl Rankins FNP-BC [place]    History of Present Illness: Telemedicine visit for: Establish care She has a past medical history of Anxiety, Dyspnea, HTN, and benign schwannoma s/p laminectomy, bronchoscopy, L VATS with resection of posterior superior mediastinal tumor.  Patient has been counseled on age-appropriate routine health concerns for screening and prevention. These are reviewed and up-to-date. Referrals have been placed accordingly. Immunizations are up-to-date or declined.    PAP SMEAR: Over due. Last pap normal per patient.   Essential Hypertension Well controlled. Taking amlodipine 10 mg daily, HCTZ 12.5 mg daily, lisinopril 20 mg daily. Denies chest pain, shortness of breath,  palpitations, lightheadedness, dizziness, headaches or BLE edema.  BP Readings from Last 3 Encounters:  02/21/20 137/88  02/01/20 127/85  01/04/20 105/75    Anxiety and Depression She is currently taking prozac 10 mg daily. Does not feel it is very effective. She was previously prescribed 40 mg of prozac in the past. Will increase to 20 mg today. She will also be started on hydroxyzine 10 mg TID prn.    Past Medical History:  Diagnosis Date  . Anxiety   . Dyspnea    on exertion  . Hypertension     Past Surgical History:  Procedure Laterality Date  . APPENDECTOMY    . LAMINECTOMY Left 12/19/2019   Procedure: THORACIC LAMINECTOMY FOR TUMOR, LEFT THORACIC TWO- THORACIC THREE;  Surgeon: [name], MD;  Location: [facility];  Service: Neurosurgery;  Laterality: Left;  Marland Kitchen VIDEO ASSISTED THORACOSCOPY (VATS)/WEDGE RESECTION Left 12/19/2019   Procedure: VIDEO ASSISTED THORACOSCOPY (VATS)/RESECTION POSTERIOR MEDIASTINAL MASS WITH INTERCOSTAL NERVE BLOCK;  Surgeon: Grace Isaac, MD;  Location: [facility];  Service: Thoracic;  Laterality: Left;  Marland Kitchen VIDEO BRONCHOSCOPY N/A 12/19/2019   Procedure: VIDEO BRONCHOSCOPY;  Surgeon: Grace Isaac, MD;  Location: [facility] OR;  Service: Thoracic;  Laterality: N/A;    Family History  Problem Relation Age of Onset  . Hyperlipidemia Mother   . Heart disease Mother        AMI; s/p stenting  . Stroke Father 79  . Hypertension Father     Social History   Socioeconomic History  . Marital status: Married    Spouse name: Not on file  . Number of children: Not on file  . Years of education: Not on file  .  Highest education level: Not on file  Occupational History  . Not on file  Tobacco Use  . Smoking status: Passive Smoke Exposure - Never Smoker  . Smokeless tobacco: Never Used  . Tobacco comment: mother smokes- is frequently around mother.   Substance and Sexual Activity  . Alcohol use: No    Alcohol/week: 0.0 standard drinks  . Drug use: No  .  Sexual activity: Yes    Birth control/protection: Surgical    Comment: Husband vasectomy  Other Topics Concern  . Not on file  Social History Narrative   Marital status: married      Children: 3 children (24, 74, 46); no grandchildren.      Employment:  Environmental consultant.      Tobacco:      Alcohol:      Exercise:         Social Determinants of Health   Financial Resource Strain: Not on file  Food Insecurity: Not on file  Transportation Needs: Not on file  Physical Activity: Not on file  Stress: Not on file  Social Connections: Not on file     Observations/Objective: Awake, alert and oriented x 3   Review of Systems  Constitutional: Negative for fever, malaise/fatigue and weight loss.  HENT: Negative.  Negative for nosebleeds.   Eyes: Negative.  Negative for blurred vision, double vision and photophobia.  Respiratory: Negative.  Negative for cough and shortness of breath.   Cardiovascular: Negative.  Negative for chest pain, palpitations and leg swelling.  Gastrointestinal: Negative.  Negative for heartburn, nausea and vomiting.  Musculoskeletal: Negative.  Negative for myalgias.  Neurological: Negative.  Negative for dizziness, focal weakness, seizures and headaches.  Psychiatric/Behavioral: Positive for depression. Negative for suicidal ideas. The patient is nervous/anxious. The patient does not have insomnia.     Assessment and Plan: Jeniyah was seen today for new patient (initial visit).  Diagnoses and all orders for this visit:  Encounter to establish care  Essential hypertension -     amLODipine (NORVASC) 10 MG tablet; Take 1 tablet (10 mg total) by mouth daily. -     lisinopril (ZESTRIL) 20 MG tablet; Take 1 tablet (20 mg total) by mouth daily. -     hydrochlorothiazide (HYDRODIURIL) 12.5 MG tablet; Take 1 tablet (12.5 mg total) by mouth daily. Continue all antihypertensives as prescribed.  Remember to bring in your blood pressure log with you for your follow up  appointment.  DASH/Mediterranean Diets are healthier choices for HTN.    Depressive disorder -     FLUoxetine (PROZAC) 20 MG tablet; Take 1 tablet (20 mg total) by mouth daily. May increase to 40 mg after 2 weeks if needed -     hydrOXYzine (ATARAX/VISTARIL) 10 MG tablet; Take 1 tablet (10 mg total) by mouth 3 (three) times daily as needed for anxiety.  Dyslipidemia, goal LDL below 100 -     atorvastatin (LIPITOR) 20 MG tablet; Take 1 tablet (20 mg total) by mouth daily. INSTRUCTIONS: Work on a low fat, heart healthy diet and participate in regular aerobic exercise program by working out at least 150 minutes per week; 5 days a week-30 minutes per day. Avoid red meat/beef/steak,  fried foods. junk foods, sodas, sugary drinks, unhealthy snacking, alcohol and smoking.  Drink at least 80 oz of water per day and monitor your carbohydrate intake daily.     Follow Up Instructions Return for PAP SMEAR.     I discussed the assessment and treatment plan with the  patient. The patient was provided an opportunity to ask questions and all were answered. The patient agreed with the plan and demonstrated an understanding of the instructions.   The patient was advised to call back or seek an in-person evaluation if the symptoms worsen or if the condition fails to improve as anticipated.  I provided 16 minutes of non-face-to-face time during this encounter including median intraservice time, reviewing previous notes, labs, imaging, medications and explaining diagnosis and management.  Gildardo Pounds, FNP-BC

## 2020-05-13 NOTE — Telephone Encounter (Signed)
Called patient and LVM advising her to call [phone] to schedule an appt. Patient needs to be scheduled for pap smear at her convenience.

## 2020-05-23 ENCOUNTER — Ambulatory Visit: Payer: Self-pay | Admitting: Cardiothoracic Surgery

## 2020-05-29 NOTE — Progress Notes (Signed)
[address]       [place] [address]             [phone]      Ayerim D Yochim [place] Medical Record #[id number] Date of Birth: 1970/12/19  Referring: Judieth Keens, MD Primary Care: Gildardo Pounds, NP Primary Cardiologist: No primary care provider on file.   Chief Complaint:   POST OP FOLLOW UP OPERATIVE REPORT DATE OF PROCEDURE:  12/19/2019 PREOPERATIVE DIAGNOSIS:  Large left chest mass with involvement of T2 nerve root. POSTOPERATIVE DIAGNOSIS:  Large left chest mass with involvement of T2 nerve root, final pathology pending, clinically suspicious for schwannoma. PROCEDURE PERFORMED:   1.  Bronchoscopy.   2.  Left video-assisted thoracoscopy with resection of posterior superior mediastinal tumor.   3.  Intercostal nerve block with Exparel.  SURGEON:  Lanelle Bal, MD  Case done in conjunction with neurosurgeon , Dr. 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 THORACIC TWO TUMOR, LEFT, EXCISION:  - Benign schwannoma.  B. INTRAMUSCULAR THORACIC LESION, EXCISION:  - Benign schwannoma.  C. MEDIASTINAL MASS, PARTIAL POSTERIOR, RESECTION:  - Benign schwannoma.  D. MEDIASTINAL MASS, PARTIAL POSTERIOR, RESECTION:  - Benign schwannoma.   History of Present Illness:     Patient returns to the office today in follow-up after laminectomy and video-assisted left thoracoscopy with resection of large posterior mediastinal tumor.  Final path confirmed benign schwannoma.  Early after surgery patient was somewhat limited neuropathic pain involving the left shoulder or left chest wall-she was started on gabapentin after which she noted significant improvement in the discomfort.  She notes that she is now back almost 100%  Functionality without significant discomfort.   Past  Medical History:  Diagnosis Date  . Anxiety   . Dyspnea    on exertion  . Hypertension      Social History   Tobacco Use  Smoking Status Passive Smoke Exposure - Never Smoker  Smokeless Tobacco Never Used  Tobacco Comment   mother smokes- is frequently around mother.     Social History   Substance and Sexual Activity  Alcohol Use No  . Alcohol/week: 0.0 standard drinks     No Known Allergies  Current Outpatient Medications  Medication Sig Dispense Refill  . albuterol (VENTOLIN HFA) 108 (90 Base) MCG/ACT inhaler Inhale 1-2 puffs into the lungs every 6 (six) hours as needed for wheezing or shortness of breath. 8 g 2  . amLODipine (NORVASC) 10 MG tablet Take 1 tablet (10 mg total) by mouth daily. 90 tablet 0  . atorvastatin (LIPITOR) 20 MG tablet Take 1 tablet (20 mg total) by mouth daily. 90 tablet 3  . ferrous [id number] C-folic acid (TRINSICON / FOLTRIN) capsule Take 1 capsule by mouth 2 (two) times daily after a meal. 60 capsule 1  . FLUoxetine (PROZAC) 20 MG tablet Take 1 tablet (20 mg total) by mouth daily. May increase to 40 mg after 2 weeks if needed 30 tablet 3  . gabapentin (NEURONTIN) 300 MG capsule Take 1 capsule (300 mg total) by mouth 3 (three) times daily. 90 capsule 0  . hydrochlorothiazide (HYDRODIURIL) 12.5 MG tablet Take 1 tablet (12.5 mg total) by mouth daily. 90 tablet 0  . hydrOXYzine (ATARAX/VISTARIL) 10 MG tablet Take 1 tablet (10 mg total) by mouth 3 (three) times daily  as needed for anxiety. 60 tablet 1  . lisinopril (ZESTRIL) 20 MG tablet Take 1 tablet (20 mg total) by mouth daily. 90 tablet 0  . Multiple Vitamin (MULTIVITAMIN WITH MINERALS) TABS tablet Take 1 tablet by mouth daily.     No current facility-administered medications for this visit.       Physical Exam: BP 117/82 (BP Location: Right Arm, Patient Position: Sitting)   Pulse 80   Resp 20   Ht 5\' 6"  (1.676 m)   Wt 211 lb (95.7 kg)   SpO2 98% Comment: RA  BMI 34.06 kg/m   General appearance: alert, cooperative and no distress Head: Normocephalic, without obvious abnormality, atraumatic Neck: no adenopathy, no carotid bruit, no JVD, supple, symmetrical, trachea midline and thyroid not enlarged, symmetric, no tenderness/mass/nodules Lymph nodes: Cervical, supraclavicular, and axillary nodes normal. Resp: clear to auscultation bilaterally Back: symmetric, no curvature. ROM normal. No CVA tenderness. Cardio: regular rate and rhythm, S1, S2 normal, no murmur, click, rub or gallop GI: soft, non-tender; bowel sounds normal; no masses,  no organomegaly Extremities: extremities normal, atraumatic, no cyanosis or edema and Homans sign is negative, no sign of DVT Neurologic: Grossly normal-patient has a mild droop of the left eyelid-appearance of left Horner's syndrome  Diagnostic Studies & Laboratory data:     Recent Radiology Findings:  DG Chest 2 View  Result Date: 05/30/2020 CLINICAL DATA:  50 year old female with history of chest mass. EXAM: CHEST - 2 VIEW COMPARISON:  Chest x-ray 02/01/2020. FINDINGS: Lung volumes are normal. No consolidative airspace disease. No pleural effusions. No pneumothorax. No pulmonary nodule or mass noted. Pulmonary vasculature and the cardiomediastinal silhouette are within normal limits. IMPRESSION: No radiographic evidence of acute cardiopulmonary disease. Electronically Signed   By: Vinnie Langton M.D.   On: 05/30/2020 11:42   Narrative & Impression  CLINICAL DATA:  Recent VATS procedure for left-sided pulmonary mass  EXAM: CHEST - 2 VIEW  COMPARISON:  December 22, 2019 chest radiograph and previous PET-CT November 07, 2019  FINDINGS: There is ill-defined increased opacity in the medial left upper lobe which likely represents postoperative scarring and/or hemorrhage; a small amount of residual mass in this area cannot be excluded by radiography. Lungs elsewhere are clear. Heart is upper normal in size with pulmonary  vascularity normal. No adenopathy. No bone lesions.  IMPRESSION: The opacity in the left upper lobe medially is smaller, likely due to resolving postoperative hemorrhage. There may well be scarring in this area. A small amount of residual mass in this area cannot be entirely excluded by radiography. Elsewhere lungs are clear. Heart upper normal in size. No adenopathy evident.   Electronically Signed   By: Lowella Grip III M.D.   On: 01/04/2020 15:10     Recent Lab Findings: Lab Results  Component Value Date   WBC 6.8 02/21/2020   HGB 12.1 02/21/2020   HCT 36.2 02/21/2020   PLT 273 02/21/2020   GLUCOSE 95 02/21/2020   CHOL 402 (H) 02/21/2020   TRIG 110 02/21/2020   HDL 83 02/21/2020   LDLCALC 301 (H) 02/21/2020   ALT 11 12/21/2019   AST 12 02/21/2020   NA 145 (H) 02/21/2020   K 4.3 02/21/2020   CL 108 (H) 02/21/2020   CREATININE 1.02 (H) 02/21/2020   BUN 10 02/21/2020   CO2 29 12/21/2019   TSH 0.721 02/21/2020   INR 1.0 12/15/2019      Assessment / Plan:   #1 patient stable following recent resection of a large  posterior mediastinal mass which on final pathology was confirmed as a benign schwannoma-the mass was resected as a combined procedure with neurosurgery performed laminectomy and then intrathoracic resection of the mediastinal mass.-Postop neuropathic pain is much improved-patient notes almost 100% functionality with movement and strength.  Mild left Horner's  Patient has a follow-up MRI of the spine in the next 4 to 5 weeks can follow-up with neurosurgery.  Make a follow-up appointment thoracic surgery with a chest x-ray in 6 months. She will discuss with Dr. Lemar Lofty or decreasing her gabapentin depending symptoms.     Medication Changes: No orders of the defined types were placed in this encounter.     Grace Isaac MD      [address] [place] [address] Office [phone]     05/30/2020 11:52  AM

## 2020-05-30 ENCOUNTER — Ambulatory Visit
Admission: RE | Admit: 2020-05-30 | Discharge: 2020-05-30 | Disposition: A | Discharge status: Home / Self Care | Payer: No Typology Code available for payment source | Source: Ambulatory Visit | Attending: Cardiothoracic Surgery | Admitting: Cardiothoracic Surgery

## 2020-05-30 ENCOUNTER — Ambulatory Visit: Payer: No Typology Code available for payment source | Admitting: Cardiothoracic Surgery

## 2020-05-30 ENCOUNTER — Other Ambulatory Visit (HOSPITAL_COMMUNITY): Payer: Self-pay | Admitting: Neurosurgery

## 2020-05-30 ENCOUNTER — Other Ambulatory Visit: Payer: Self-pay

## 2020-05-30 ENCOUNTER — Encounter: Payer: Self-pay | Admitting: Cardiothoracic Surgery

## 2020-05-30 ENCOUNTER — Other Ambulatory Visit: Payer: Self-pay | Admitting: Neurosurgery

## 2020-05-30 VITALS — BP 117/82 | HR 80 | Resp 20 | Ht 66.0 in | Wt 211.0 lb

## 2020-05-30 DIAGNOSIS — Z09 Encounter for follow-up examination after completed treatment for conditions other than malignant neoplasm: Secondary | ICD-10-CM

## 2020-05-30 DIAGNOSIS — D3614 Benign neoplasm of peripheral nerves and autonomic nervous system of thorax: Secondary | ICD-10-CM

## 2020-05-30 DIAGNOSIS — R222 Localized swelling, mass and lump, trunk: Secondary | ICD-10-CM

## 2020-06-28 ENCOUNTER — Other Ambulatory Visit: Payer: Self-pay

## 2020-06-28 ENCOUNTER — Ambulatory Visit (HOSPITAL_COMMUNITY)
Admission: RE | Admit: 2020-06-28 | Discharge: 2020-06-28 | Disposition: A | Discharge status: Home / Self Care | Payer: No Typology Code available for payment source | Source: Ambulatory Visit | Attending: Neurosurgery | Admitting: Neurosurgery

## 2020-06-28 DIAGNOSIS — D3614 Benign neoplasm of peripheral nerves and autonomic nervous system of thorax: Secondary | ICD-10-CM | POA: Diagnosis not present

## 2020-06-28 MED ORDER — GADOBUTROL 1 MMOL/ML IV SOLN
10.0000 mL | Freq: Once | INTRAVENOUS | Status: AC | PRN
  Administered 2020-06-28: 10 mL via INTRAVENOUS

## 2020-10-15 ENCOUNTER — Other Ambulatory Visit: Payer: Self-pay | Admitting: Physician Assistant

## 2020-10-15 ENCOUNTER — Other Ambulatory Visit: Payer: Self-pay | Admitting: Nurse Practitioner

## 2020-10-15 DIAGNOSIS — I10 Essential (primary) hypertension: Secondary | ICD-10-CM

## 2020-10-15 NOTE — Telephone Encounter (Signed)
Requested Prescriptions  Pending Prescriptions Disp Refills  . hydrochlorothiazide (HYDRODIURIL) 12.5 MG tablet [Pharmacy Med Name: hydroCHLOROthiazide 12.5 MG Oral Tablet] 90 tablet 0    Sig: Take 1 tablet by mouth once daily     Cardiovascular: Diuretics - Thiazide Failed - 10/15/2020  2:33 PM      Failed - Cr in normal range and within 360 days    Creat  Date Value Ref Range Status  06/14/2013 1.19 (H) 0.50 - 1.10 mg/dL Final   Creatinine, Ser  Date Value Ref Range Status  02/21/2020 1.02 (H) 0.57 - 1.00 mg/dL Final         Failed - Na in normal range and within 360 days    Sodium  Date Value Ref Range Status  02/21/2020 145 (H) 134 - 144 mmol/L Final         Passed - Ca in normal range and within 360 days    Calcium  Date Value Ref Range Status  02/21/2020 9.6 8.7 - 10.2 mg/dL Final   Calcium, Ion  Date Value Ref Range Status  12/19/2019 1.15 1.15 - 1.40 mmol/L Final         Passed - K in normal range and within 360 days    Potassium  Date Value Ref Range Status  02/21/2020 4.3 3.5 - 5.2 mmol/L Final         Passed - Last BP in normal range    BP Readings from Last 1 Encounters:  05/30/20 117/82         Passed - Valid encounter within last 6 months    Recent Outpatient Visits          5 months ago Encounter to establish care   [place], Vernia Buff, NP   7 months ago Essential hypertension   [place], [place], [place]   9 months ago Hospital discharge follow-up   [place], [place], NP   6 years ago Essential hypertension, benign   [facility], Janalee Dane, MD   7 years ago Essential hypertension, benign   [facility] at [facility], Renette Butters, MD

## 2020-11-20 ENCOUNTER — Other Ambulatory Visit: Payer: Self-pay | Admitting: Nurse Practitioner

## 2020-11-20 ENCOUNTER — Other Ambulatory Visit: Payer: Self-pay | Admitting: Physician Assistant

## 2020-11-20 DIAGNOSIS — I10 Essential (primary) hypertension: Secondary | ICD-10-CM

## 2020-11-20 DIAGNOSIS — F329 Major depressive disorder, single episode, unspecified: Secondary | ICD-10-CM

## 2020-11-20 DIAGNOSIS — F32A Depression, unspecified: Secondary | ICD-10-CM

## 2020-11-20 NOTE — Telephone Encounter (Signed)
Requested medications are due for refill today. A little early  Requested medications are on the active medications list.  yes  Last refill. 10/15/2020  Future visit scheduled.   no  Notes to clinic.  Pt is more than 3 months overdue for OV.

## 2020-11-26 ENCOUNTER — Other Ambulatory Visit: Payer: Self-pay | Admitting: Thoracic Surgery (Cardiothoracic Vascular Surgery)

## 2020-11-26 DIAGNOSIS — R222 Localized swelling, mass and lump, trunk: Secondary | ICD-10-CM

## 2020-11-29 ENCOUNTER — Encounter: Payer: No Typology Code available for payment source | Admitting: Thoracic Surgery (Cardiothoracic Vascular Surgery)

## 2020-12-19 ENCOUNTER — Other Ambulatory Visit: Payer: Self-pay | Admitting: Neurosurgery

## 2020-12-19 ENCOUNTER — Other Ambulatory Visit (HOSPITAL_COMMUNITY): Payer: Self-pay | Admitting: Neurosurgery

## 2020-12-19 DIAGNOSIS — D3614 Benign neoplasm of peripheral nerves and autonomic nervous system of thorax: Secondary | ICD-10-CM

## 2021-01-09 IMAGING — CR DG CHEST 2V
2 series · 2 of 2 positions shown · non-contrast
Comparison: Prior chest radiograph, CT, and PET.

CLINICAL DATA: Preop thoracic laminectomy for tumor and video
bronchoscopy.

EXAM:
CHEST - 2 VIEW

[w chest pa]
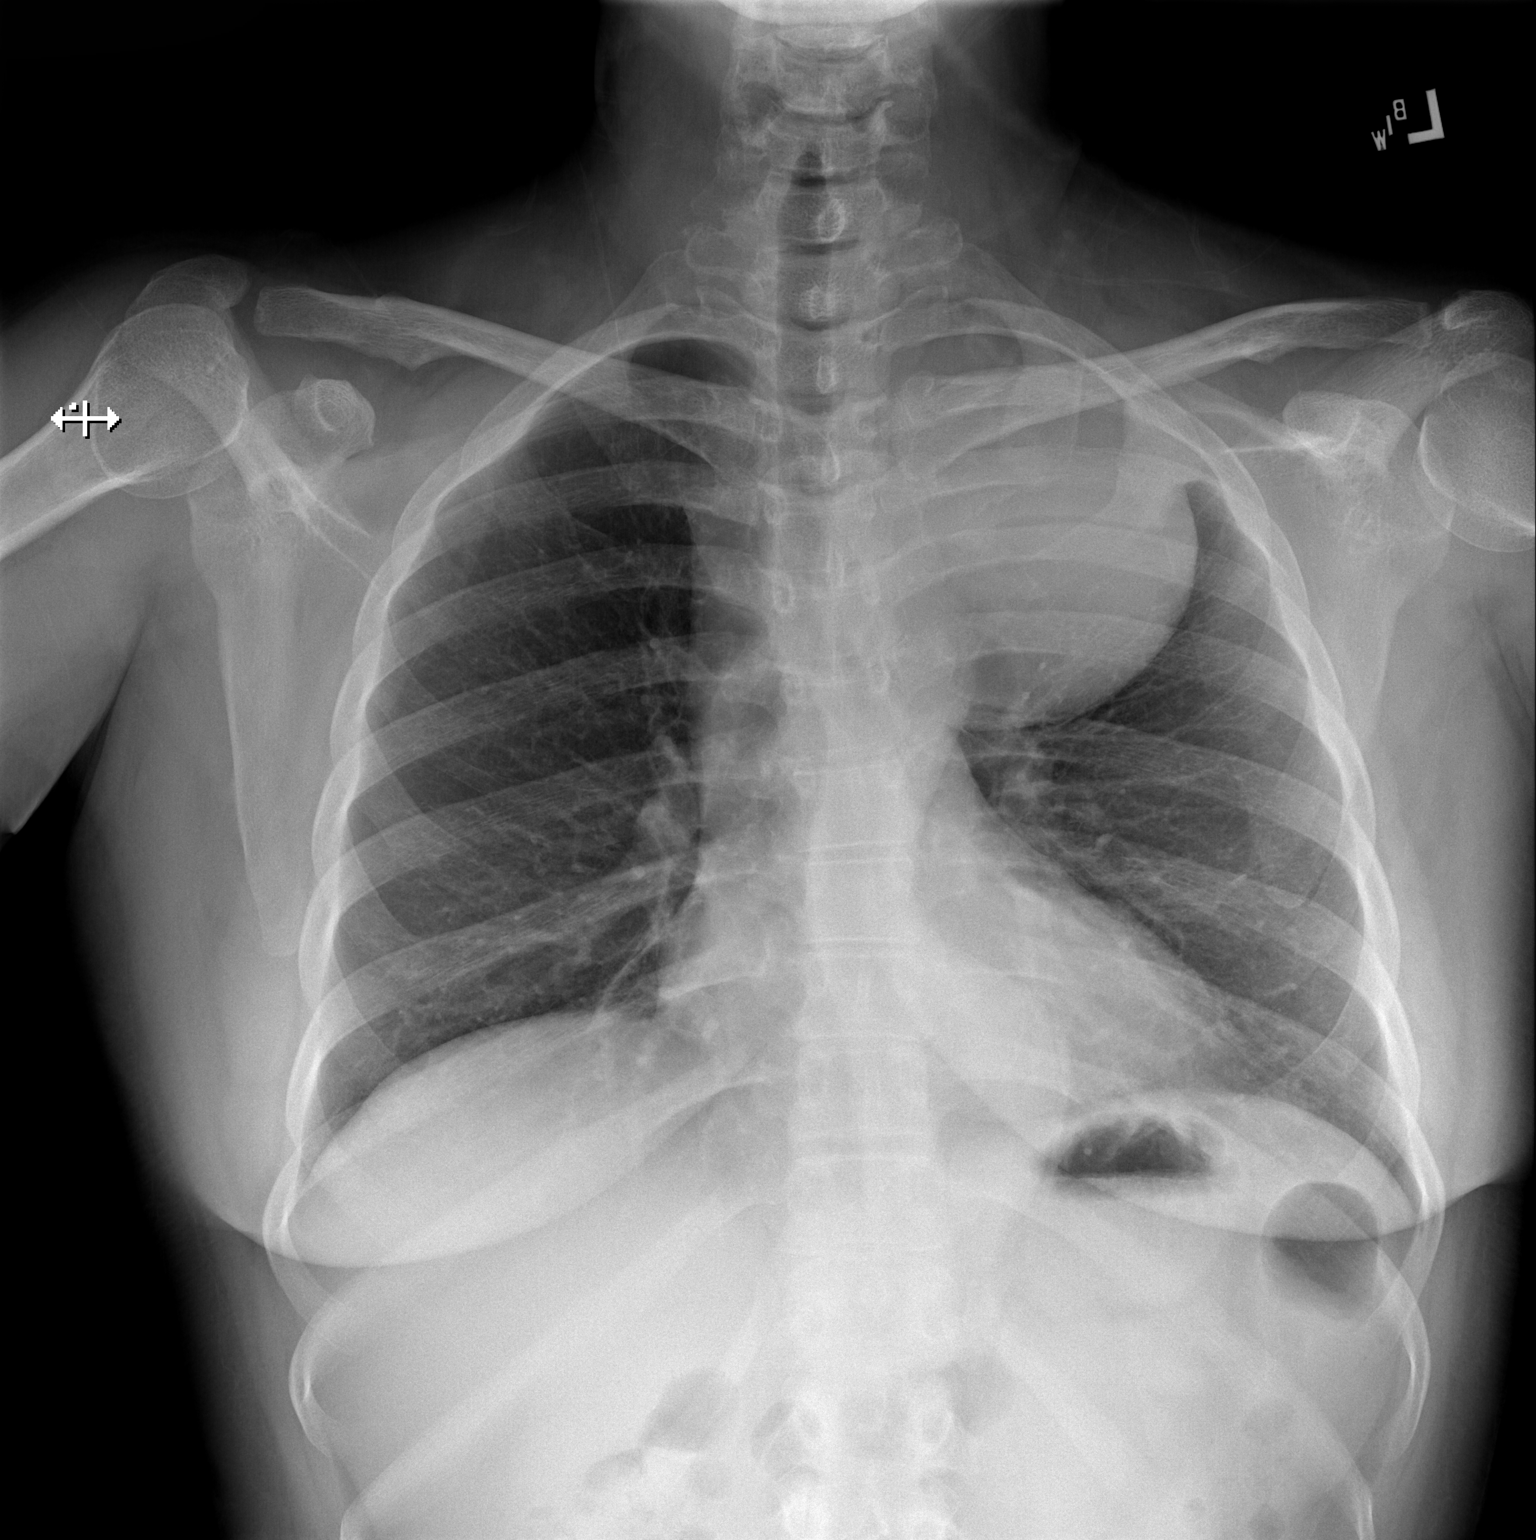

[w chest lat]
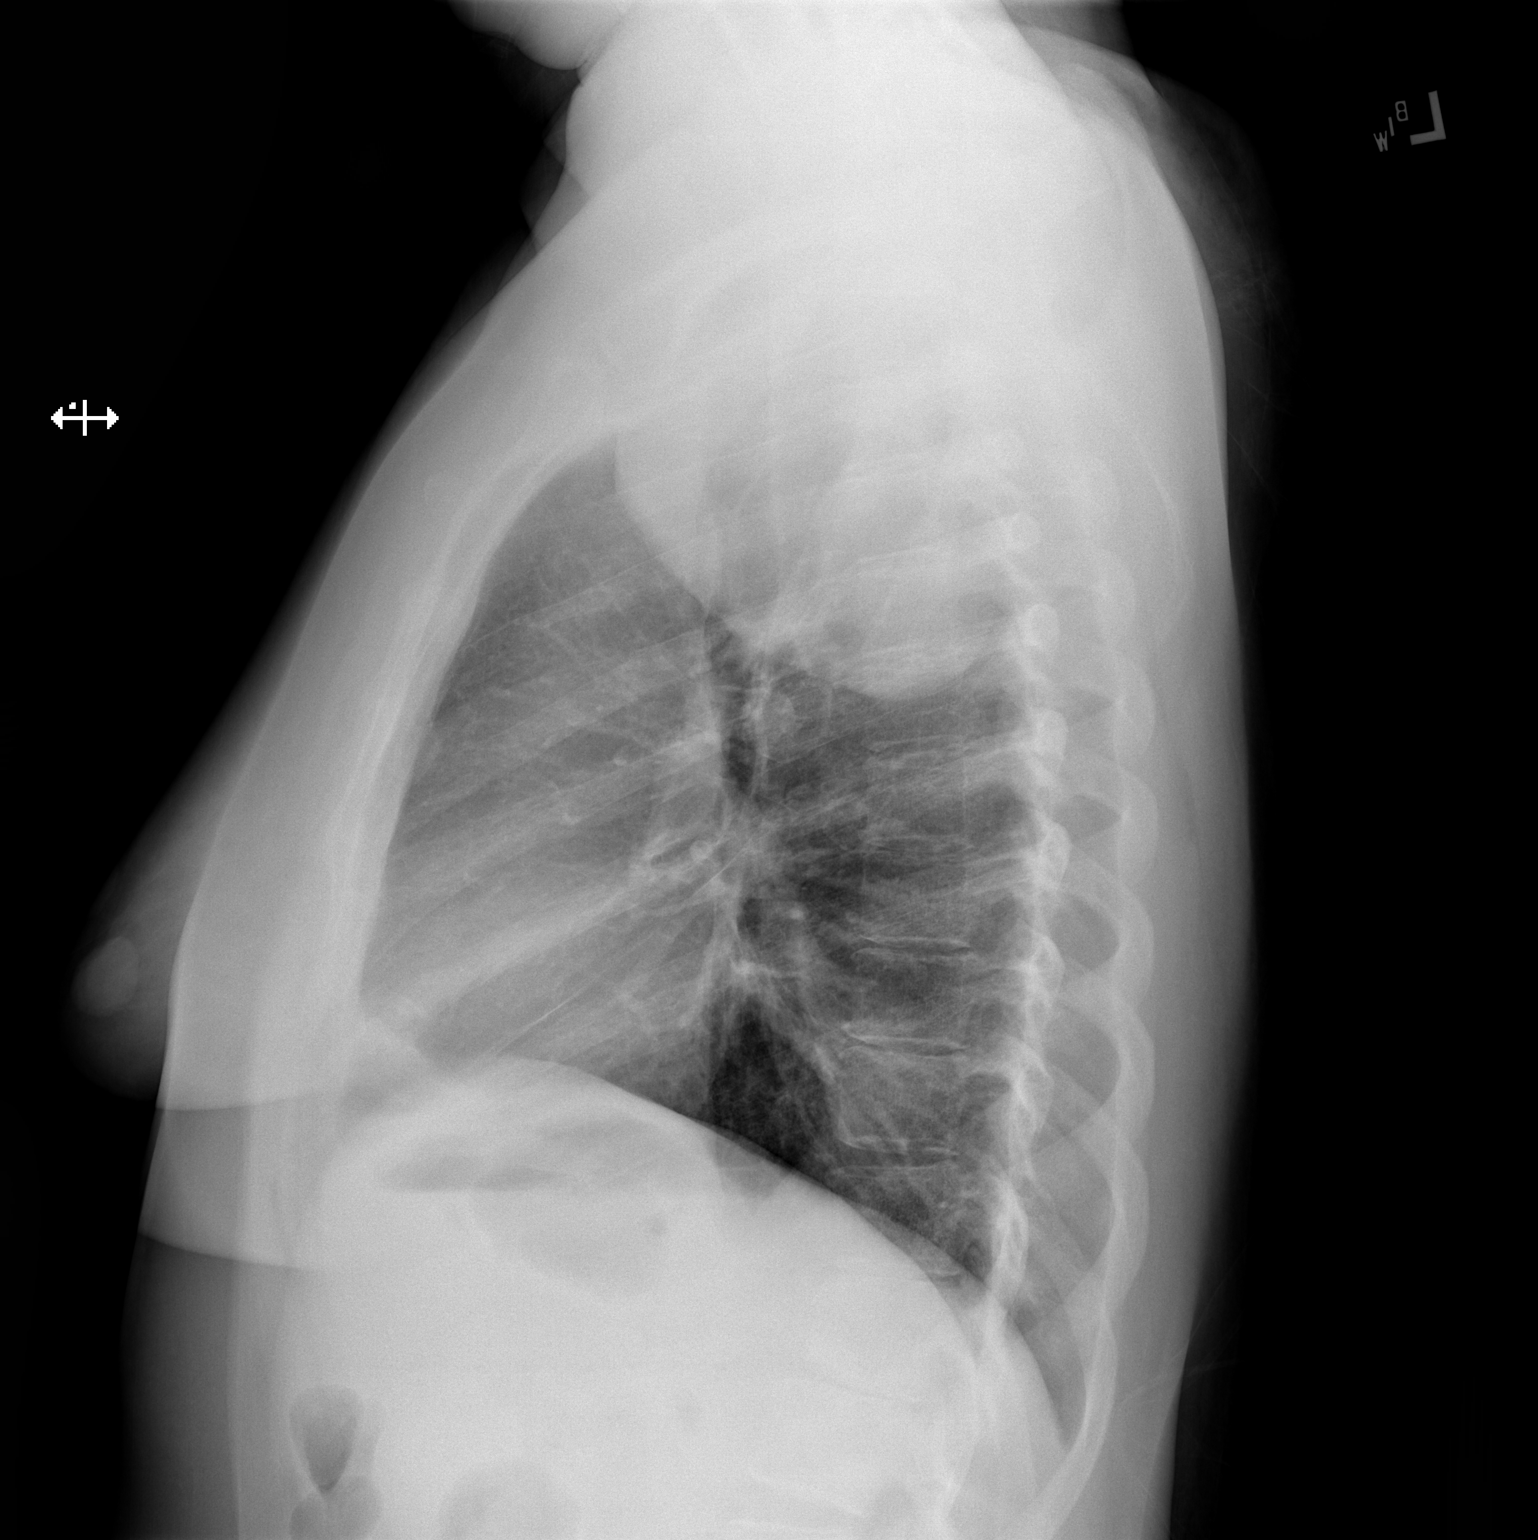

[2 of 2 positions shown; findings below may reference images not displayed]

FINDINGS: Stable radiographic appearance of rounded left apical mass. Heart is
normal in size. There is descending thoracic tortuosity. No
pneumothorax or pleural effusion. No acute airspace disease.
Previous ground-glass opacities on chest CT are not well
demonstrated by radiograph stable osseous structures.
IMPRESSION: 1. Stable radiographic appearance of rounded left apical mass.
2. No acute findings. Descending thoracic aortic tortuosity is
unchanged.

## 2021-01-14 ENCOUNTER — Other Ambulatory Visit: Payer: Self-pay | Admitting: Nurse Practitioner

## 2021-01-14 DIAGNOSIS — I10 Essential (primary) hypertension: Secondary | ICD-10-CM

## 2021-01-15 ENCOUNTER — Other Ambulatory Visit: Payer: Self-pay

## 2021-01-15 ENCOUNTER — Ambulatory Visit: Payer: No Typology Code available for payment source | Attending: Physician Assistant | Admitting: Physician Assistant

## 2021-01-15 ENCOUNTER — Encounter: Payer: Self-pay | Admitting: Physician Assistant

## 2021-01-15 ENCOUNTER — Ambulatory Visit (HOSPITAL_COMMUNITY)
Admission: RE | Admit: 2021-01-15 | Discharge: 2021-01-15 | Disposition: A | Discharge status: Home / Self Care | Payer: No Typology Code available for payment source | Source: Ambulatory Visit | Attending: Physician Assistant | Admitting: Physician Assistant

## 2021-01-15 VITALS — BP 159/99 | HR 67 | Temp 98.9°F | Resp 18 | Ht 67.0 in | Wt 225.0 lb

## 2021-01-15 DIAGNOSIS — M25551 Pain in right hip: Secondary | ICD-10-CM | POA: Diagnosis not present

## 2021-01-15 DIAGNOSIS — Z6835 Body mass index (BMI) 35.0-35.9, adult: Secondary | ICD-10-CM | POA: Diagnosis not present

## 2021-01-15 DIAGNOSIS — I10 Essential (primary) hypertension: Secondary | ICD-10-CM

## 2021-01-15 MED ORDER — IBUPROFEN 800 MG PO TABS: 800.0000 mg | ORAL_TABLET | Freq: Three times a day (TID) | ORAL | 0 refills | Status: DC | PRN

## 2021-01-15 MED ORDER — CYCLOBENZAPRINE HCL 10 MG PO TABS: 10.0000 mg | ORAL_TABLET | Freq: Three times a day (TID) | ORAL | 0 refills | Status: DC | PRN

## 2021-01-15 NOTE — Patient Instructions (Signed)
You are going to take ibuprofen every 8 hours as needed, and you can use the muscle relaxer every 8 hours as needed as well.  I do encourage you to sleep on your left side.  We will call you with your x-ray results.  Please let us know if there is anything else we can do for you.  Kennieth Rad, PA-C Physician Assistant [facility] [email or web address]   Hip Pain The hip is the joint between the upper legs and the lower pelvis. The bones, cartilage, tendons, and muscles of your hip joint support your body and allow you to move around. Hip pain can range from a minor ache to severe pain in one or both of your hips. The pain may be felt on the inside of the hip joint near the groin, or on the outside near the buttocks and upper thigh. You may also have swelling or stiffness in your hip area. Follow these instructions at home: Managing pain, stiffness, and swelling   If directed, put ice on the painful area. To do this: Put ice in a plastic bag. Place a towel between your skin and the bag. Leave the ice on for 20 minutes, 2-3 times a day. If directed, apply heat to the affected area as often as told by your health care provider. Use the heat source that your health care provider recommends, such as a moist heat pack or a heating pad. Place a towel between your skin and the heat source. Leave the heat on for 20-30 minutes. Remove the heat if your skin turns bright red. This is especially important if you are unable to feel pain, heat, or cold. You may have a greater risk of getting burned. Activity Do exercises as told by your health care provider. Avoid activities that cause pain. General instructions  Take over-the-counter and prescription medicines only as told by your health care provider. Keep a journal of your symptoms. Write down: How often you have hip pain. The location of your pain. What the pain feels like. What makes  the pain worse. Sleep with a pillow between your legs on your most comfortable side. Keep all follow-up visits as told by your health care provider. This is important. Contact a health care provider if: You cannot put weight on your leg. Your pain or swelling continues or gets worse after one week. It gets harder to walk. You have a fever. Get help right away if: You fall. You have a sudden increase in pain and swelling in your hip. Your hip is red or swollen or very tender to touch. Summary Hip pain can range from a minor ache to severe pain in one or both of your hips. The pain may be felt on the inside of the hip joint near the groin, or on the outside near the buttocks and upper thigh. Avoid activities that cause pain. Write down how often you have hip pain, the location of the pain, what makes it worse, and what it feels like. This information is not intended to replace advice given to you by your health care provider. Make sure you discuss any questions you have with your health care provider. Document Revised: 07/18/2018 Document Reviewed: 07/18/2018 Elsevier Patient Education  [place].

## 2021-01-15 NOTE — Progress Notes (Signed)
Patient has not eaten today and has not taken medication today. Patient reports intermttent pain for the past 2 months from the right Hip " popping in/out of joint". Patients husband shared patient was unable to get out of bed this morning.

## 2021-01-15 NOTE — Progress Notes (Signed)
Established Patient Office Visit  Subjective:  Patient ID: Brittany Morgan, female    DOB: 11-Feb-1971  Age: 50 y.o. MRN: [id number]  CC:  Chief Complaint  Patient presents with   right hip pain     HPI Brittany Morgan reports that she has been experiencing right hip pain for the past 3 months.  Reports approximately 3 months ago prior to the pain beginning, she fell several times as she was running up some stairs.  States that she fell forward.  Reports that she did not notice much pain after the fall, however 1 week later she states that she stepped off a curb onto her right foot with pain and heard a popping sound.  Reports that she felt her right hip pop out of place, states that this has occurred many times since then.  States that it will be very painful to walk until it pops back in "on its own".  Reports that she has for the past couple weeks now been experiencing pain on waking, makes it hard for her to get out of bed.  Does endorse that she will have some radiation to her right upper leg with the pain.  Denies numbness or tingling.  Reports that she has tried Tylenol without relief, takes gabapentin without relief.  States that after her hip has popped back in, she will be sore, states a hot shower does offer some relief.     Past Medical History:  Diagnosis Date   Anxiety    Dyspnea    on exertion   Hypertension     Past Surgical History:  Procedure Laterality Date   APPENDECTOMY     LAMINECTOMY Left 12/19/2019   Procedure: THORACIC LAMINECTOMY FOR TUMOR, LEFT THORACIC TWO- THORACIC THREE;  Surgeon: [name], MD;  Location: [facility];  Service: Neurosurgery;  Laterality: Left;   VIDEO ASSISTED THORACOSCOPY (VATS)/WEDGE RESECTION Left 12/19/2019   Procedure: VIDEO ASSISTED THORACOSCOPY (VATS)/RESECTION POSTERIOR MEDIASTINAL MASS WITH INTERCOSTAL NERVE BLOCK;  Surgeon: Grace Isaac, MD;  Location: [facility];  Service: Thoracic;  Laterality: Left;   VIDEO BRONCHOSCOPY  N/A 12/19/2019   Procedure: VIDEO BRONCHOSCOPY;  Surgeon: Grace Isaac, MD;  Location: [facility] OR;  Service: Thoracic;  Laterality: N/A;    Family History  Problem Relation Age of Onset   Hyperlipidemia Mother    Heart disease Mother        AMI; s/p stenting   Stroke Father 41   Hypertension Father     Social History   Socioeconomic History   Marital status: Married    Spouse name: Not on file   Number of children: Not on file   Years of education: Not on file   Highest education level: Not on file  Occupational History   Not on file  Tobacco Use   Smoking status: Passive Smoke Exposure - Never Smoker   Smokeless tobacco: Never   Tobacco comments:    mother smokes- is frequently around mother.   Substance and Sexual Activity   Alcohol use: No    Alcohol/week: 0.0 standard drinks   Drug use: No   Sexual activity: Yes    Birth control/protection: Surgical    Comment: Husband vasectomy  Other Topics Concern   Not on file  Social History Narrative   Marital status: married      Children: 3 children (24, 77, 64); no grandchildren.      Employment:  Environmental consultant.      Tobacco:  Alcohol:      Exercise:         Social Determinants of Health   Financial Resource Strain: Not on file  Food Insecurity: Not on file  Transportation Needs: Not on file  Physical Activity: Not on file  Stress: Not on file  Social Connections: Not on file  Intimate Partner Violence: Not on file    Outpatient Medications Prior to Visit  Medication Sig Dispense Refill   albuterol (VENTOLIN HFA) 108 (90 Base) MCG/ACT inhaler Inhale 1-2 puffs into the lungs every 6 (six) hours as needed for wheezing or shortness of breath. 8 g 2   amLODipine (NORVASC) 10 MG tablet Take 1 tablet (10 mg total) by mouth daily. 90 tablet 0   atorvastatin (LIPITOR) 20 MG tablet Take 1 tablet (20 mg total) by mouth daily. 90 tablet 3   ferrous [id number] C-folic acid (TRINSICON / FOLTRIN) capsule Take  1 capsule by mouth 2 (two) times daily after a meal. 60 capsule 1   FLUoxetine (PROZAC) 20 MG tablet Take 1 tablet (20 mg total) by mouth daily. May increase to 40 mg after 2 weeks if needed 30 tablet 3   gabapentin (NEURONTIN) 300 MG capsule Take 1 capsule (300 mg total) by mouth 3 (three) times daily. 90 capsule 0   lisinopril (ZESTRIL) 20 MG tablet Take 1 tablet (20 mg total) by mouth daily. 90 tablet 0   Multiple Vitamin (MULTIVITAMIN WITH MINERALS) TABS tablet Take 1 tablet by mouth daily.     hydrochlorothiazide (HYDRODIURIL) 12.5 MG tablet Take 1 tablet by mouth once daily 90 tablet 0   No facility-administered medications prior to visit.    No Known Allergies  ROS Review of Systems  Constitutional:  Negative for chills and fever.  HENT: Negative.    Eyes: Negative.   Respiratory:  Negative for shortness of breath.   Cardiovascular:  Negative for chest pain.  Gastrointestinal: Negative.   Endocrine: Negative.   Genitourinary: Negative.   Musculoskeletal:  Positive for arthralgias, gait problem and myalgias.  Skin: Negative.   Allergic/Immunologic: Negative.   Neurological:  Negative for syncope and headaches.  Hematological: Negative.   Psychiatric/Behavioral: Negative.       Objective:    Physical Exam Vitals and nursing note reviewed.  Constitutional:      General: She is not in acute distress.    Appearance: Normal appearance. She is obese. She is not ill-appearing.  HENT:     Head: Normocephalic and atraumatic.     Right Ear: External ear normal.     Left Ear: External ear normal.     Nose: Nose normal.     Mouth/Throat:     Mouth: Mucous membranes are moist.     Pharynx: Oropharynx is clear.  Eyes:     Extraocular Movements: Extraocular movements intact.     Conjunctiva/sclera: Conjunctivae normal.     Pupils: Pupils are equal, round, and reactive to light.  Cardiovascular:     Rate and Rhythm: Normal rate and regular rhythm.     Pulses: Normal pulses.      Heart sounds: Normal heart sounds.  Pulmonary:     Effort: Pulmonary effort is normal.     Breath sounds: Normal breath sounds.  Musculoskeletal:     Cervical back: Normal range of motion and neck supple.     Right hip: Tenderness present. Decreased range of motion.     Left hip: Normal.     Right upper leg: No swelling.  Left upper leg: Normal.     Right knee: Normal.     Left knee: Normal.  Skin:    General: Skin is warm and dry.  Neurological:     General: No focal deficit present.     Mental Status: She is alert and oriented to person, place, and time.  Psychiatric:        Mood and Affect: Mood normal.        Behavior: Behavior normal.        Thought Content: Thought content normal.        Judgment: Judgment normal.    BP (!) 159/99 (BP Location: Left Arm, Patient Position: Sitting, Cuff Size: Normal)   Pulse 67   Temp 98.9 F (37.2 C) (Oral)   Resp 18   Ht 5\' 7"  (1.702 m)   Wt 225 lb (102.1 kg)   LMP  (LMP Unknown)   SpO2 99%   BMI 35.24 kg/m  Wt Readings from Last 3 Encounters:  01/15/21 225 lb (102.1 kg)  05/30/20 211 lb (95.7 kg)  02/21/20 212 lb (96.2 kg)     Health Maintenance Due  Topic Date Due   TETANUS/TDAP  Never done   PAP SMEAR-Modifier  Never done   COLONOSCOPY (Pts 45-30yrs Insurance coverage will need to be confirmed)  Never done   MAMMOGRAM  Never done   Zoster Vaccines- Shingrix (1 of 2) Never done   COVID-19 Vaccine (3 - Booster for Pfizer series) 08/22/2019   INFLUENZA VACCINE  Never done    There are no preventive care reminders to display for this patient.  Lab Results  Component Value Date   TSH 0.721 02/21/2020   Lab Results  Component Value Date   WBC 6.8 02/21/2020   HGB 12.1 02/21/2020   HCT 36.2 02/21/2020   MCV 91 02/21/2020   PLT 273 02/21/2020   Lab Results  Component Value Date   NA 145 (H) 02/21/2020   K 4.3 02/21/2020   CO2 29 12/21/2019   GLUCOSE 95 02/21/2020   BUN 10 02/21/2020   CREATININE 1.02  (H) 02/21/2020   BILITOT 0.4 02/21/2020   ALKPHOS 58 02/21/2020   AST 12 02/21/2020   ALT 11 12/21/2019   PROT 7.0 02/21/2020   ALBUMIN 4.4 02/21/2020   CALCIUM 9.6 02/21/2020   ANIONGAP 6 12/21/2019   Lab Results  Component Value Date   CHOL 402 (H) 02/21/2020   Lab Results  Component Value Date   HDL 83 02/21/2020   Lab Results  Component Value Date   LDLCALC 301 (H) 02/21/2020   Lab Results  Component Value Date   TRIG 110 02/21/2020   Lab Results  Component Value Date   CHOLHDL 4.8 (H) 02/21/2020   No results found for: HGBA1C    Assessment & Plan:   Problem List Items Addressed This Visit       Cardiovascular and Mediastinum   Essential hypertension     Other   Class 2 severe obesity due to excess calories with serious comorbidity and body mass index (BMI) of 35.0 to 35.9 in adult [facility])   Other Visit Diagnoses     Acute right hip pain    -  Primary   Relevant Medications   cyclobenzaprine (FLEXERIL) 10 MG tablet   ibuprofen (ADVIL) 800 MG tablet   Other Relevant Orders   DG Hip Unilat W OR W/O Pelvis 2-3 Views Right       Meds ordered this encounter  Medications  cyclobenzaprine (FLEXERIL) 10 MG tablet    Sig: Take 1 tablet (10 mg total) by mouth 3 (three) times daily as needed for muscle spasms.    Dispense:  30 tablet    Refill:  0    Order Specific Question:   Supervising Provider    Answer:   Asencion Noble E [[id number]]   ibuprofen (ADVIL) 800 MG tablet    Sig: Take 1 tablet (800 mg total) by mouth every 8 (eight) hours as needed.    Dispense:  30 tablet    Refill:  0    Order Specific Question:   Supervising Provider    Answer:   Joya Gaskins, PATRICK E [[id number]]  1. Acute right hip pain Trial Advil, Flexeril.  Patient education given on supportive care.  Red flags given for prompt reevaluation. - DG Hip Unilat W OR W/O Pelvis 2-3 Views Right; Future - cyclobenzaprine (FLEXERIL) 10 MG tablet; Take 1 tablet (10 mg total) by mouth 3 (three) times  daily as needed for muscle spasms.  Dispense: 30 tablet; Refill: 0 - ibuprofen (ADVIL) 800 MG tablet; Take 1 tablet (800 mg total) by mouth every 8 (eight) hours as needed.  Dispense: 30 tablet; Refill: 0  2. Essential hypertension Patient encouraged to check blood pressure at home, keep a written log and have available for all office visits.  3. Class 2 severe obesity due to excess calories with serious comorbidity and body mass index (BMI) of 35.0 to 35.9 in adult [facility])    I have reviewed the patient's medical history (PMH, PSH, Social History, Family History, Medications, and allergies) , and have been updated if relevant. I spent 30 minutes reviewing chart and  face to face time with patient.     Follow-up: Return if symptoms worsen or fail to improve.    Loraine Grip Mayers, PA-C

## 2021-01-16 ENCOUNTER — Telehealth: Payer: Self-pay | Admitting: *Deleted

## 2021-01-16 NOTE — Addendum Note (Signed)
Addended by: Kennieth Rad on: 01/16/2021 08:34 AM   Modules accepted: Orders

## 2021-01-16 NOTE — Telephone Encounter (Signed)
Patient verified DOB Patient is aware of no concerns being noted on xray and patient has been scheduled with ortho for monday 11/7

## 2021-01-16 NOTE — Telephone Encounter (Signed)
-----   Message from Kennieth Rad, [place] sent at 01/16/2021  8:34 AM EDT ----- Please call patient and let her know that her x-ray of her hip did not show any acute abnormalities.  Due to the level and length of her pain, I am going to start a referral for her to be seen by orthopedics.

## 2021-01-22 ENCOUNTER — Ambulatory Visit (INDEPENDENT_AMBULATORY_CARE_PROVIDER_SITE_OTHER): Payer: No Typology Code available for payment source | Admitting: Orthopaedic Surgery

## 2021-01-22 ENCOUNTER — Ambulatory Visit: Payer: Self-pay

## 2021-01-22 ENCOUNTER — Encounter: Payer: Self-pay | Admitting: Orthopaedic Surgery

## 2021-01-22 ENCOUNTER — Other Ambulatory Visit: Payer: Self-pay

## 2021-01-22 DIAGNOSIS — M545 Low back pain, unspecified: Secondary | ICD-10-CM | POA: Diagnosis not present

## 2021-01-22 MED ORDER — PREDNISONE 10 MG (21) PO TBPK: ORAL_TABLET | ORAL | 0 refills | Status: DC

## 2021-01-22 NOTE — Progress Notes (Signed)
Office Visit Note   Patient: Brittany Morgan           Date of Birth: 12-26-1970           MRN: [id number] Visit Date: 01/22/2021              Requested by: Mayers, Loraine Grip, PA-C [id number],  [place] [address] PCP: Gildardo Pounds, NP   Assessment & Plan: Visit Diagnoses:  1. Low back pain, unspecified back pain laterality, unspecified chronicity, unspecified whether sciatica present     Plan: Impression is right-sided and lower lumbar paraspinous pain and popping.  I feel as though her symptoms are likely coming from an inflammatory response from the recent injury.  I would like to start her on a steroid taper and have her start applying ice/heat and continue to rest.  If her symptoms have not improved at all over the next few weeks she will call and let us know.  Otherwise, follow-up with [place] as needed.  Follow-Up Instructions: Return if symptoms worsen or fail to improve.   Orders:  Orders Placed This Encounter  Procedures   XR Lumbar Spine 2-3 Views    Meds ordered this encounter  Medications   predniSONE (STERAPRED UNI-PAK 21 TAB) 10 MG (21) TBPK tablet    Sig: Take as directed    Dispense:  21 tablet    Refill:  0       Procedures: No procedures performed   Clinical Data: No additional findings.   Subjective: Chief Complaint  Patient presents with   Right Hip - New Patient (Initial Visit)    HPI patient is a pleasant 50 year old female who comes in today with right lower back and lateral hip pain and popping.  This initially began after falling on a set of stairs about 2 months ago.  Her symptoms seem to slightly improved but then she awkwardly stepped off a curb causing worsening symptoms.  The pain she has is worse lying on the right side as well as when she is raising her leg to get in the car.  She has been taking NSAIDs muscle relaxers without relief.  No paresthesias noted.  She denies any anterior thigh or groin pain.  Review of  Systems as detailed in HPI.  All others reviewed and are negative.   Objective: Vital Signs: LMP  (LMP Unknown)   Physical Exam well-developed well-nourished female in no acute distress.  Alert and oriented x3.  Ortho Exam lumbar spine exam shows moderate tenderness to the right paraspinous musculature.  No spinous tenderness.  Negative logroll negative FADIR.  She has some moderate tenderness to the greater trochanter.  No focal weakness.  She is neurovascular intact distally.  Specialty Comments:  No specialty comments available.  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   Hyperlipidemia 06/24/2016   Depressive disorder 06/15/2016   Essential hypertension 06/15/2016   Past Medical History:  Diagnosis Date   Anxiety    Dyspnea    on exertion  Hypertension     Family History  Problem Relation Age of Onset   Hyperlipidemia Mother    Heart disease Mother        AMI; s/p stenting   Stroke Father 8   Hypertension Father     Past Surgical History:  Procedure Laterality Date   APPENDECTOMY     LAMINECTOMY Left 12/19/2019   Procedure: THORACIC LAMINECTOMY FOR TUMOR, LEFT THORACIC TWO- THORACIC THREE;  Surgeon: [name], MD;  Location: [facility];  Service: Neurosurgery;  Laterality: Left;   VIDEO ASSISTED THORACOSCOPY (VATS)/WEDGE RESECTION Left 12/19/2019   Procedure: VIDEO ASSISTED THORACOSCOPY (VATS)/RESECTION POSTERIOR MEDIASTINAL MASS WITH INTERCOSTAL NERVE BLOCK;  Surgeon: Grace Isaac, MD;  Location: [facility];  Service: Thoracic;   Laterality: Left;   VIDEO BRONCHOSCOPY N/A 12/19/2019   Procedure: VIDEO BRONCHOSCOPY;  Surgeon: Grace Isaac, MD;  Location: [facility] OR;  Service: Thoracic;  Laterality: N/A;   Social History   Occupational History   Not on file  Tobacco Use   Smoking status: Passive Smoke Exposure - Never Smoker   Smokeless tobacco: Never   Tobacco comments:    mother smokes- is frequently around mother.   Substance and Sexual Activity   Alcohol use: No    Alcohol/week: 0.0 standard drinks   Drug use: No   Sexual activity: Yes    Birth control/protection: Surgical    Comment: Husband vasectomy

## 2021-03-31 ENCOUNTER — Other Ambulatory Visit: Payer: Self-pay | Admitting: Family Medicine

## 2021-03-31 DIAGNOSIS — I10 Essential (primary) hypertension: Secondary | ICD-10-CM

## 2021-04-04 ENCOUNTER — Ambulatory Visit: Payer: Self-pay | Admitting: *Deleted

## 2021-04-04 ENCOUNTER — Other Ambulatory Visit: Payer: Self-pay | Admitting: Nurse Practitioner

## 2021-04-04 ENCOUNTER — Other Ambulatory Visit: Payer: Self-pay | Admitting: Pharmacist

## 2021-04-04 DIAGNOSIS — E785 Hyperlipidemia, unspecified: Secondary | ICD-10-CM

## 2021-04-04 DIAGNOSIS — I10 Essential (primary) hypertension: Secondary | ICD-10-CM

## 2021-04-04 MED ORDER — HYDROCHLOROTHIAZIDE 12.5 MG PO TABS: 12.5000 mg | ORAL_TABLET | Freq: Every day | ORAL | 0 refills | Status: DC

## 2021-04-04 MED ORDER — AMLODIPINE BESYLATE 10 MG PO TABS: 10.0000 mg | ORAL_TABLET | Freq: Every day | ORAL | 0 refills | Status: DC

## 2021-04-04 MED ORDER — LISINOPRIL 20 MG PO TABS: 20.0000 mg | ORAL_TABLET | Freq: Every day | ORAL | 0 refills | Status: DC

## 2021-04-04 NOTE — Progress Notes (Signed)
Patient requesting refills for BP medication. Refills sent to her [facility].

## 2021-04-04 NOTE — Telephone Encounter (Signed)
Medication Refill - Medication: hydrochlorothiazide (HYDRODIURIL) 12.5 MG tablet Pt has two days left   Has the patient contacted their pharmacy? Yes.   (Agent: If no, request that the patient contact the pharmacy for the refill. If patient does not wish to contact the pharmacy document the reason why and proceed with request.) (Agent: If yes, when and what did the pharmacy advise?)  Preferred Pharmacy (with phone number or street name):  [place], [place] - [address]  [address] [place] [address]  Phone: [phone] Fax: [phone]   Has the patient been seen for an appointment in the last year OR does the patient have an upcoming appointment? Yes.    Agent: Please be advised that RX refills may take up to 3 business days. We ask that you follow-up with your pharmacy.

## 2021-04-04 NOTE — Telephone Encounter (Signed)
°  Chief Complaint: elevated BP  Symptoms: BP 173/111 P 69 rechecked for BP 161/110 P 70, headache. Feels pressure behind ear and eyes at times . Frequency: today  Pertinent Negatives: Patient denies chest pain , difficulty breathing, visual issues, no weakness on either side  Disposition: [] ED /[x] Urgent Care (no appt availability in office) / [] Appointment(In office/virtual)/ []  [place]/ [] Home Care/ [] Refused Recommended Disposition /[] [place] [facility]/ []  Follow-up with PCP Additional Notes:  Recommended UC or PCE. Patient has not been taking  HTN medications. Lisinopril and amlodipine expired and has not refilled. Has missed hydrodiuril x 2 days . Please advise . Encouraged intake of water and to recheck BP within the hour and if symptoms noted go to ED.           Reason for Disposition  Ran out of BP medications  Answer Assessment - Initial Assessment Questions 1. BLOOD PRESSURE: "What is the blood pressure?" "Did you take at least two measurements 5 minutes apart?"     BP 173/111 p 69 rechecked for BP 161/110 P 70 2. ONSET: "When did you take your blood pressure?"     Now  3. HOW: "How did you obtain the blood pressure?" (e.g., visiting nurse, automatic home BP monitor)     Automatic BP monitor  4. HISTORY: "Do you have a history of high blood pressure?"     Yes 5. MEDICATIONS: "Are you taking any medications for blood pressure?" "Have you missed any doses recently?"     Missed x 2 days  of hydrodiuril  and has not been taking amlodipine or lisinopril due to being expired . Patient thought PCP would reorder if she needed to continue taking . 6. OTHER SYMPTOMS: "Do you have any symptoms?" (e.g., headache, chest pain, blurred vision, difficulty breathing, weakness)     Heart beating fast and headache  7. PREGNANCY: "Is there any chance you are pregnant?" "When was your last menstrual period?"     na  Protocols used: Blood Pressure - High-A-AH

## 2021-04-04 NOTE — Telephone Encounter (Signed)
Scheduled apt for f/u HTN

## 2021-04-17 ENCOUNTER — Other Ambulatory Visit: Payer: Self-pay | Admitting: Nurse Practitioner

## 2021-04-17 DIAGNOSIS — F32A Depression, unspecified: Secondary | ICD-10-CM

## 2021-04-17 MED ORDER — FLUOXETINE HCL 20 MG PO TABS: 20.0000 mg | ORAL_TABLET | Freq: Every day | ORAL | 0 refills | Status: DC

## 2021-04-17 NOTE — Telephone Encounter (Signed)
Copied from [place] [id number]. Topic: Quick Communication - Rx Refill/Question >> Apr 17, 2021  2:42 PM Yvette Rack wrote: Medication: FLUoxetine (PROZAC) 20 MG tablet   Has the patient contacted their pharmacy? Yes.  Pt told to contact provider (Agent: If no, request that the patient contact the pharmacy for the refill. If patient does not wish to contact the pharmacy document the reason why and proceed with request.) (Agent: If yes, when and what did the pharmacy advise?)  Preferred Pharmacy (with phone number or street name): [place], [place] - [address]  Phone: [phone] Fax: [phone]  Has the patient been seen for an appointment in the last year OR does the patient have an upcoming appointment? Yes.    Agent: Please be advised that RX refills may take up to 3 business days. We ask that you follow-up with your pharmacy.

## 2021-04-17 NOTE — Telephone Encounter (Signed)
Requested Prescriptions  Pending Prescriptions Disp Refills   FLUoxetine (PROZAC) 20 MG tablet 180 tablet 0    Sig: Take 1 tablet (20 mg total) by mouth daily. May increase to 40 mg after 2 weeks if needed     Psychiatry:  Antidepressants - SSRI Passed - 04/17/2021  4:38 PM      Passed - Completed PHQ-2 or PHQ-9 in the last 360 days      Passed - Valid encounter within last 6 months    Recent Outpatient Visits          3 months ago Acute right hip pain   [place], [place]   11 months ago Encounter to establish care   [place], Vernia Buff, NP   1 year ago Essential hypertension   [place], [place]   1 year ago Hospital discharge follow-up   [place], [place], NP   6 years ago Essential hypertension, benign   [facility], Janalee Dane, MD      Future Appointments            In 2 months Gildardo Pounds, NP [place]

## 2021-06-23 ENCOUNTER — Encounter: Payer: Self-pay | Admitting: Nurse Practitioner

## 2021-06-23 ENCOUNTER — Ambulatory Visit: Payer: No Typology Code available for payment source | Attending: Nurse Practitioner | Admitting: Nurse Practitioner

## 2021-06-23 VITALS — BP 131/85 | HR 61 | Wt 232.4 lb

## 2021-06-23 DIAGNOSIS — I10 Essential (primary) hypertension: Secondary | ICD-10-CM

## 2021-06-23 DIAGNOSIS — Z862 Personal history of diseases of the blood and blood-forming organs and certain disorders involving the immune mechanism: Secondary | ICD-10-CM

## 2021-06-23 DIAGNOSIS — M25551 Pain in right hip: Secondary | ICD-10-CM

## 2021-06-23 DIAGNOSIS — F32A Depression, unspecified: Secondary | ICD-10-CM

## 2021-06-23 DIAGNOSIS — Z1231 Encounter for screening mammogram for malignant neoplasm of breast: Secondary | ICD-10-CM

## 2021-06-23 DIAGNOSIS — E782 Mixed hyperlipidemia: Secondary | ICD-10-CM

## 2021-06-23 MED ORDER — CYCLOBENZAPRINE HCL 10 MG PO TABS: 10.0000 mg | ORAL_TABLET | Freq: Three times a day (TID) | ORAL | 1 refills | Status: AC | PRN

## 2021-06-23 MED ORDER — HYDROCHLOROTHIAZIDE 25 MG PO TABS: 25.0000 mg | ORAL_TABLET | Freq: Every day | ORAL | 1 refills | Status: DC

## 2021-06-23 MED ORDER — LISINOPRIL 20 MG PO TABS: 20.0000 mg | ORAL_TABLET | Freq: Every day | ORAL | 1 refills | Status: DC

## 2021-06-23 MED ORDER — FLUOXETINE HCL 20 MG PO TABS: 40.0000 mg | ORAL_TABLET | Freq: Every day | ORAL | 1 refills | Status: DC

## 2021-06-23 MED ORDER — AMLODIPINE BESYLATE 10 MG PO TABS: 10.0000 mg | ORAL_TABLET | Freq: Every day | ORAL | 1 refills | Status: DC

## 2021-06-23 MED ORDER — PRAVASTATIN SODIUM 20 MG PO TABS: 20.0000 mg | ORAL_TABLET | Freq: Every day | ORAL | 1 refills | Status: DC

## 2021-06-23 NOTE — Patient Instructions (Signed)
Unfortunately the fluoxetine is not available in '40mg'$ . Please continue to take 2 tablets. ?

## 2021-06-23 NOTE — Progress Notes (Signed)
? ?Assessment & Plan:  ?Brittany Morgan was seen today for hypertension. ? ?Diagnoses and all orders for this visit: ? ?Essential hypertension ?-     CMP14+EGFR ?-     hydrochlorothiazide (HYDRODIURIL) 25 MG tablet; Take 1 tablet (25 mg total) by mouth daily. ?-     amLODipine (NORVASC) 10 MG tablet; Take 1 tablet (10 mg total) by mouth daily. ?-     lisinopril (ZESTRIL) 20 MG tablet; Take 1 tablet (20 mg total) by mouth daily. ?Continue all antihypertensives as prescribed.  ?Remember to bring in your blood pressure log with you for your follow up appointment.  ?DASH/Mediterranean Diets are healthier choices for HTN.   ? ?Depressive disorder ?-     FLUoxetine (PROZAC) 20 MG tablet; Take 2 tablets (40 mg total) by mouth daily. ? ?Breast cancer screening by mammogram ?-     MM 3D SCREEN BREAST BILATERAL; Future ?-     Cancel: MS DIGITAL SCREENING TOMO BILATERAL; Future ? ?Acute right hip pain ?-     cyclobenzaprine (FLEXERIL) 10 MG tablet; Take 1 tablet (10 mg total) by mouth 3 (three) times daily as needed for muscle spasms. ?Work on losing weight to help reduce joint pain. May alternate with heat and ice application for pain relief. May also alternate with acetaminophen and Ibuprofen as prescribed pain relief.  ? ?History of anemia ?-     CBC with Differential ? ?Mixed hyperlipidemia ?-     pravastatin (PRAVACHOL) 20 MG tablet; Take 1 tablet (20 mg total) by mouth daily. ?INSTRUCTIONS: Work on a low fat, heart healthy diet and participate in regular aerobic exercise program by working out at least 150 minutes per week; 5 days a week-30 minutes per day. Avoid red meat/beef/steak,  fried foods. junk foods, sodas, sugary drinks, unhealthy snacking, alcohol and smoking.  Drink at least 80 oz of water per day and monitor your carbohydrate intake daily.   ? ? ? ?Patient has been counseled on age-appropriate routine health concerns for screening and prevention. These are reviewed and up-to-date. Referrals have been placed  accordingly. Immunizations are up-to-date or declined.    ?Subjective:  ? ?Chief Complaint  ?Patient presents with  ? Hypertension  ? ?HPI ?Brittany Morgan 51 y.o. female presents to office today for follow up to HTN. She is accompanied by her spouse today.  ?She has a past medical history of Anxiety and Hypertension.  ? ?HTN ?Blood pressure is not optimal today. She endorses similar readings at home.  Will increase HCTZ from 12.61m to 25 mg daily and she will continue on amlodipine 10 mg and lisinopril 20 mg daily. We will recheck her blood pressure at her next office visit. We had a lengthy discussion regarding diet, exercise and lifestyle modifications.  ?BP Readings from Last 3 Encounters:  ?06/23/21 131/85  ?01/15/21 (!) 159/99  ?05/30/20 117/82  ?  ? ? ?Dyslipidemia ?She can not take atorvastatin due to myalgias. Will switch to pravastatin today.  ? ?Anxiety ?Feels prozac needs to be increased from 225mto 4053mNotes increased irritability despite taking 20 mg.  ? ? ? ?Review of Systems  ?Constitutional:  Negative for fever, malaise/fatigue and weight loss.  ?HENT: Negative.  Negative for nosebleeds.   ?Eyes: Negative.  Negative for blurred vision, double vision and photophobia.  ?Respiratory: Negative.  Negative for cough and shortness of breath.   ?Cardiovascular: Negative.  Negative for chest pain, palpitations and leg swelling.  ?Gastrointestinal: Negative.  Negative for heartburn, nausea and vomiting.  ?  Musculoskeletal: Negative.  Negative for myalgias.  ?Neurological: Negative.  Negative for dizziness, focal weakness, seizures and headaches.  ?Psychiatric/Behavioral:  Negative for suicidal ideas. The patient is nervous/anxious.   ? ?Past Medical History:  ?Diagnosis Date  ? Anxiety   ? Dyspnea   ? on exertion  ? Hypertension   ? ? ?Past Surgical History:  ?Procedure Laterality Date  ? APPENDECTOMY    ? LAMINECTOMY Left 12/19/2019  ? Procedure: THORACIC LAMINECTOMY FOR TUMOR, LEFT THORACIC TWO- THORACIC  THREE;  Surgeon: [name], MD;  Location: [facility];  Service: Neurosurgery;  Laterality: Left;  ? VIDEO ASSISTED THORACOSCOPY (VATS)/WEDGE RESECTION Left 12/19/2019  ? Procedure: VIDEO ASSISTED THORACOSCOPY (VATS)/RESECTION POSTERIOR MEDIASTINAL MASS WITH INTERCOSTAL NERVE BLOCK;  Surgeon: Grace Isaac, MD;  Location: [facility];  Service: Thoracic;  Laterality: Left;  ? VIDEO BRONCHOSCOPY N/A 12/19/2019  ? Procedure: VIDEO BRONCHOSCOPY;  Surgeon: Grace Isaac, MD;  Location: [facility];  Service: Thoracic;  Laterality: N/A;  ? ? ?Family History  ?Problem Relation Age of Onset  ? Hyperlipidemia Mother   ? Heart disease Mother   ?     AMI; s/p stenting  ? Stroke Father 10  ? Hypertension Father   ? ? ?Social History Reviewed with no changes to be made today.  ? ?Outpatient Medications Prior to Visit  ?Medication Sig Dispense Refill  ? albuterol (VENTOLIN HFA) 108 (90 Base) MCG/ACT inhaler Inhale 1-2 puffs into the lungs every 6 (six) hours as needed for wheezing or shortness of breath. 8 g 2  ? ferrous [id number] C-folic acid (TRINSICON / FOLTRIN) capsule Take 1 capsule by mouth 2 (two) times daily after a meal. 60 capsule 1  ? gabapentin (NEURONTIN) 300 MG capsule Take 1 capsule (300 mg total) by mouth 3 (three) times daily. 90 capsule 0  ? ibuprofen (ADVIL) 800 MG tablet Take 1 tablet (800 mg total) by mouth every 8 (eight) hours as needed. 30 tablet 0  ? Multiple Vitamin (MULTIVITAMIN WITH MINERALS) TABS tablet Take 1 tablet by mouth daily.    ? amLODipine (NORVASC) 10 MG tablet Take 1 tablet (10 mg total) by mouth daily. 90 tablet 0  ? atorvastatin (LIPITOR) 20 MG tablet Take 1 tablet (20 mg total) by mouth daily. 90 tablet 3  ? cyclobenzaprine (FLEXERIL) 10 MG tablet Take 1 tablet (10 mg total) by mouth 3 (three) times daily as needed for muscle spasms. 30 tablet 0  ? FLUoxetine (PROZAC) 20 MG tablet Take 1 tablet (20 mg total) by mouth daily. May increase to 40 mg after 2 weeks if needed 180  tablet 0  ? hydrochlorothiazide (HYDRODIURIL) 12.5 MG tablet Take 1 tablet (12.5 mg total) by mouth daily. 90 tablet 0  ? lisinopril (ZESTRIL) 20 MG tablet Take 1 tablet (20 mg total) by mouth daily. 90 tablet 0  ? predniSONE (STERAPRED UNI-PAK 21 TAB) 10 MG (21) TBPK tablet Take as directed 21 tablet 0  ? ?No facility-administered medications prior to visit.  ? ? ?No Known Allergies ? ?   ?Objective:  ?  ?BP 131/85   Pulse 61   Wt 232 lb 6.4 oz (105.4 kg)   LMP  (LMP Unknown)   SpO2 99%   BMI 36.40 kg/m?  ?Wt Readings from Last 3 Encounters:  ?06/23/21 232 lb 6.4 oz (105.4 kg)  ?01/15/21 225 lb (102.1 kg)  ?05/30/20 211 lb (95.7 kg)  ? ? ?Physical Exam ?Vitals and nursing note reviewed.  ?Constitutional:   ?  Appearance: She is well-developed.  ?HENT:  ?   Head: Normocephalic and atraumatic.  ?Cardiovascular:  ?   Rate and Rhythm: Normal rate and regular rhythm.  ?   Heart sounds: Normal heart sounds. No murmur heard. ?  No friction rub. No gallop.  ?Pulmonary:  ?   Effort: Pulmonary effort is normal. No tachypnea or respiratory distress.  ?   Breath sounds: Normal breath sounds. No decreased breath sounds, wheezing, rhonchi or rales.  ?Chest:  ?   Chest wall: No tenderness.  ?Abdominal:  ?   General: Bowel sounds are normal.  ?   Palpations: Abdomen is soft.  ?Musculoskeletal:     ?   General: Normal range of motion.  ?   Cervical back: Normal range of motion.  ?Skin: ?   General: Skin is warm and dry.  ?Neurological:  ?   Mental Status: She is alert and oriented to person, place, and time.  ?   Coordination: Coordination normal.  ?Psychiatric:     ?   Behavior: Behavior normal. Behavior is cooperative.     ?   Thought Content: Thought content normal.     ?   Judgment: Judgment normal.  ? ? ? ? ?   ?Patient has been counseled extensively about nutrition and exercise as well as the importance of adherence with medications and regular follow-up. The patient was given clear instructions to go to ER or return to  medical center if symptoms don't improve, worsen or new problems develop. The patient verbalized understanding.  ? ?Follow-up: Return for PAP SMEAR.  ? ?Gildardo Pounds, FNP-BC ?[facility] and [facility]

## 2021-06-24 LAB — CMP14+EGFR
ALT: 10 IU/L (ref 0–32)
AST: 18 IU/L (ref 0–40)
Albumin/Globulin Ratio: 1.9 (ref 1.2–2.2)
Albumin: 4.7 g/dL (ref 3.8–4.9)
Alkaline Phosphatase: 55 IU/L (ref 44–121)
BUN/Creatinine Ratio: 11 (ref 9–23)
BUN: 11 mg/dL (ref 6–24)
Bilirubin Total: 0.4 mg/dL (ref 0.0–1.2)
CO2: 26 mmol/L (ref 20–29)
Calcium: 10 mg/dL (ref 8.7–10.2)
Chloride: 101 mmol/L (ref 96–106)
Creatinine, Ser: 1.04 mg/dL — ABNORMAL HIGH (ref 0.57–1.00)
Globulin, Total: 2.5 g/dL (ref 1.5–4.5)
Glucose: 99 mg/dL (ref 70–99)
Potassium: 4.2 mmol/L (ref 3.5–5.2)
Sodium: 141 mmol/L (ref 134–144)
Total Protein: 7.2 g/dL (ref 6.0–8.5)
eGFR: 64 mL/min/{1.73_m2} (ref >59)

## 2021-06-24 LAB — CBC WITH DIFFERENTIAL/PLATELET
Basophils Absolute: 0 10*3/uL (ref 0.0–0.2)
Basos: 0 %
EOS (ABSOLUTE): 0.3 10*3/uL (ref 0.0–0.4)
Eos: 3 %
Hematocrit: 37.1 % (ref 34.0–46.6)
Hemoglobin: 12.2 g/dL (ref 11.1–15.9)
Immature Grans (Abs): 0 10*3/uL (ref 0.0–0.1)
Immature Granulocytes: 0 %
Lymphocytes Absolute: 2.4 10*3/uL (ref 0.7–3.1)
Lymphs: 30 %
MCH: 29.9 pg (ref 26.6–33.0)
MCHC: 32.9 g/dL (ref 31.5–35.7)
MCV: 91 fL (ref 79–97)
Monocytes Absolute: 0.5 10*3/uL (ref 0.1–0.9)
Monocytes: 7 %
Neutrophils Absolute: 4.6 10*3/uL (ref 1.4–7.0)
Neutrophils: 60 %
Platelets: 292 10*3/uL (ref 150–450)
RBC: 4.08 x10E6/uL (ref 3.77–5.28)
RDW: 13.1 % (ref 11.7–15.4)
WBC: 7.7 10*3/uL (ref 3.4–10.8)

## 2021-07-24 IMAGING — MR MR THORACIC SPINE WO/W CM
8 of 9 series · 33 of 48 positions shown · IV contrast (gadavist)
Comparison: 11/17/2019

CLINICAL DATA: Neural schwannoma

EXAM:
MRI THORACIC WITHOUT AND WITH CONTRAST
TECHNIQUE: Multiplanar and multiecho pulse sequences of the thoracic spine were
obtained without and with intravenous contrast.
CONTRAST:  10mL GADAVIST GADOBUTROL 1 MMOL/ML IV SOLN

[Series 16: T1 · sagittal · 4.0mm · 1.72mm/px · 2 of 5 slices shown (1 of 3)]
[im 1/5]
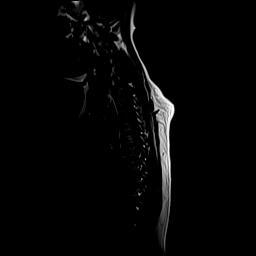
[im 5/5]
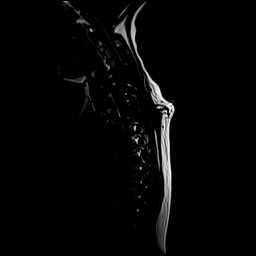

[Series 17: T1 · sagittal · 3.0mm · 1.06mm/px · 3 of 15 slices shown (2 of 3)]
[im 1/15]
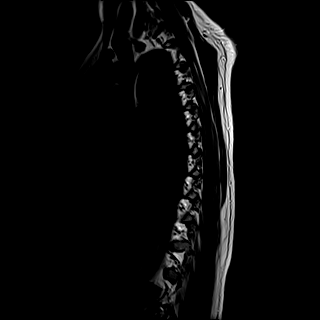
[im 8/15]
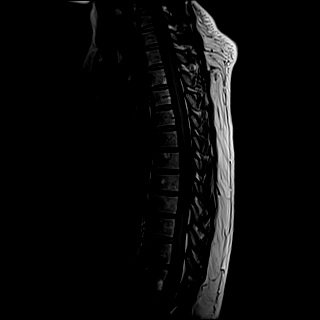
[im 15/15]
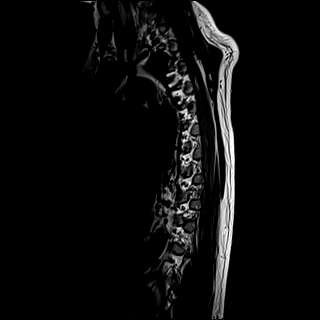

[Series 18: STIR · sagittal · 3.0mm · 1.06mm/px · 3 of 15 slices shown]
[im 1/15]
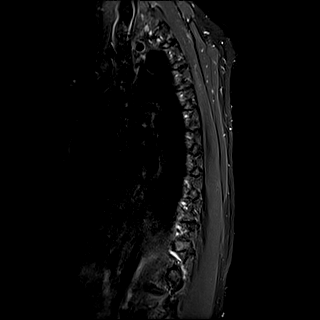
[im 8/15]
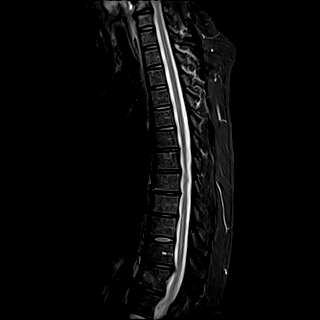
[im 15/15]
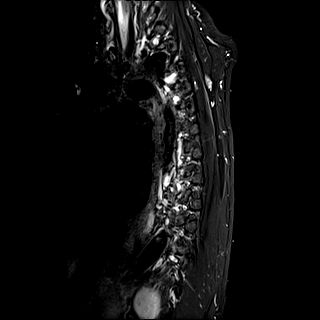

[Series 19: T2 · axial · 4.0mm · 0.78mm/px · z∈[-263,-16]mm · 8 of 46 slices shown]
[im 1/46]
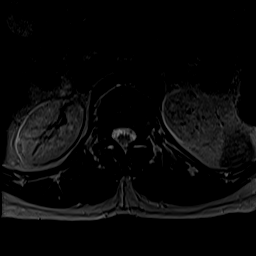
[im 7/46]
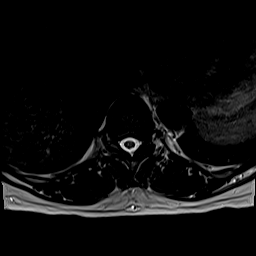
[im 13/46]
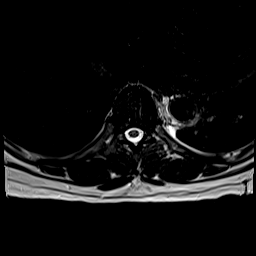
[im 20/46]
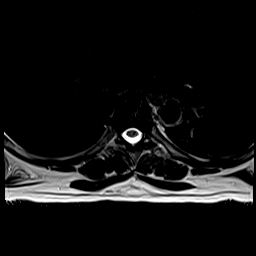
[im 26/46]
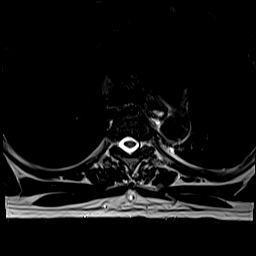
[im 33/46]
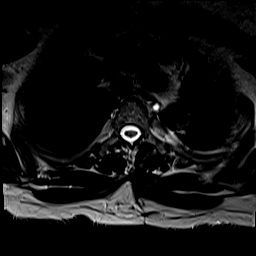
[im 39/46]
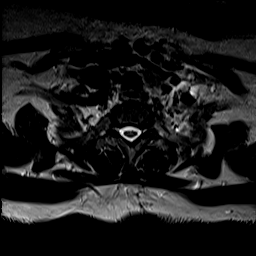
[im 46/46]
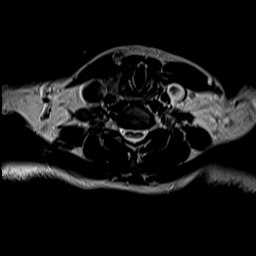

[Series 21: T1 · axial · 4.0mm · 0.39mm/px · z∈[-263,-16]mm · 8 of 46 slices shown (3 of 3)]
[im 1/46]
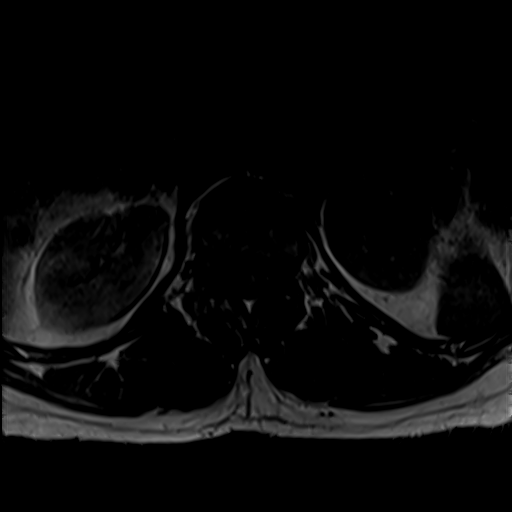
[im 7/46]
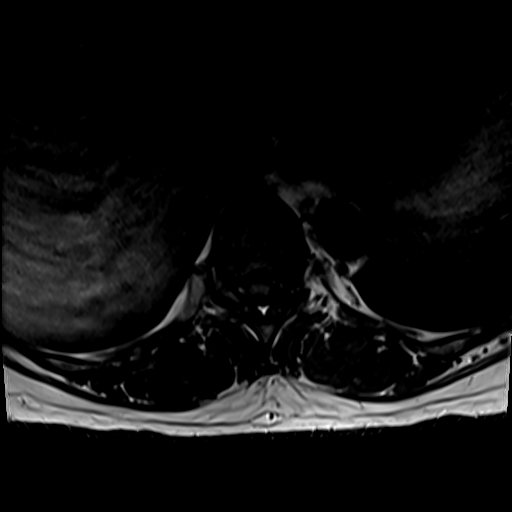
[im 13/46]
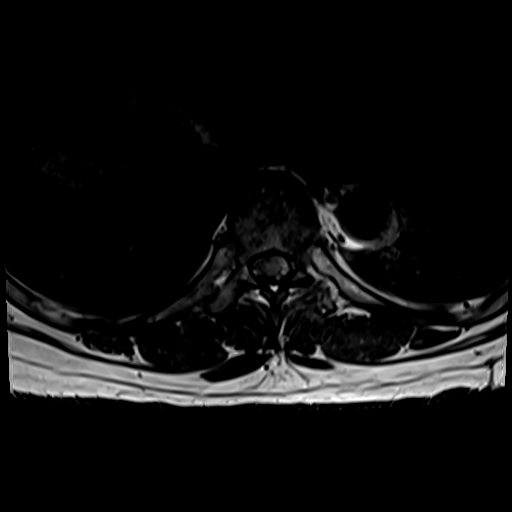
[im 20/46]
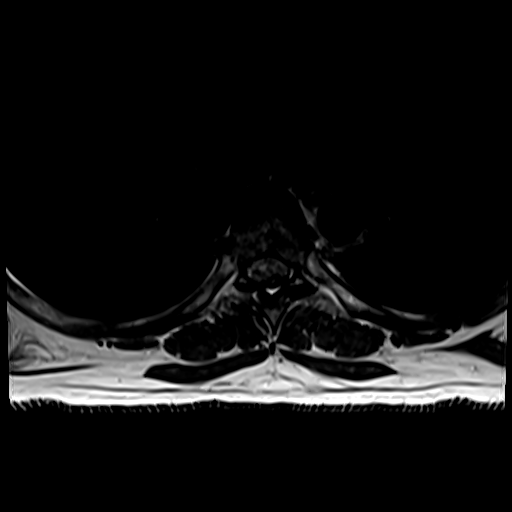
[im 26/46]
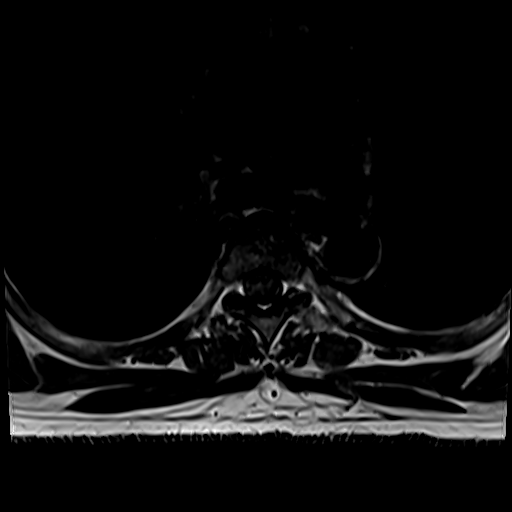
[im 33/46]
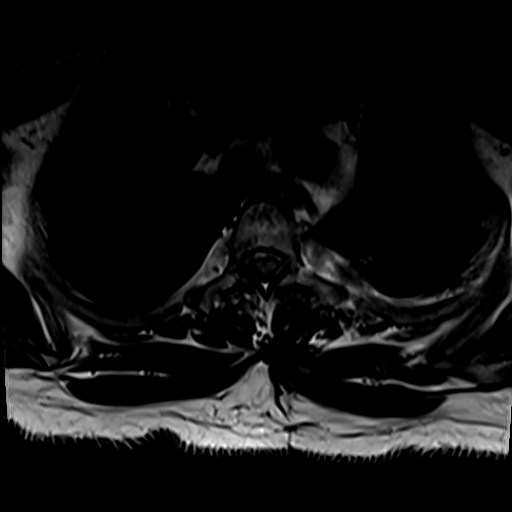
[im 39/46]
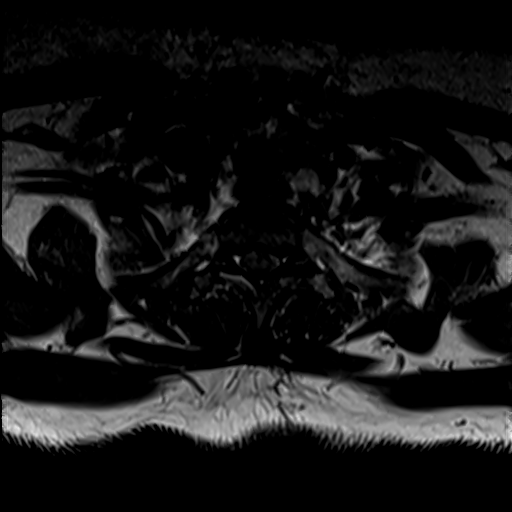
[im 46/46]
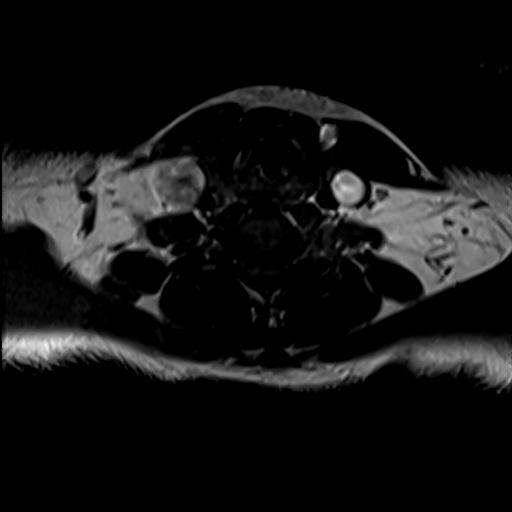

[Series 22: T2 post-contrast · sagittal · 3.0mm · 0.89mm/px · 4 of 25 slices shown]
[im 1/25]
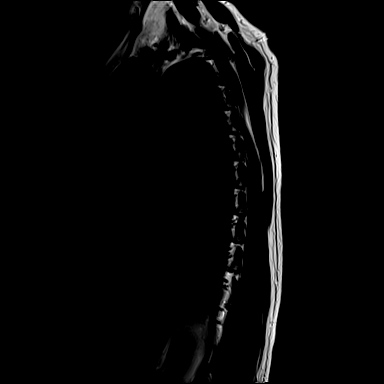
[im 9/25]
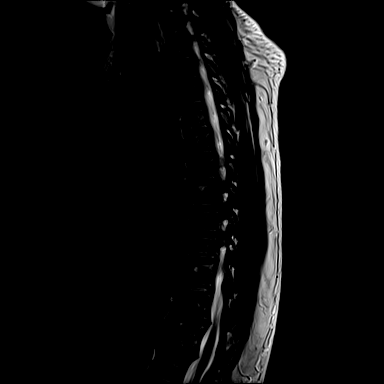
[im 17/25]
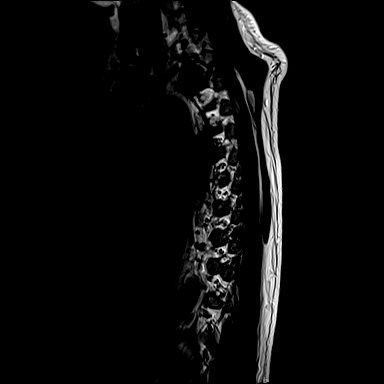
[im 25/25]
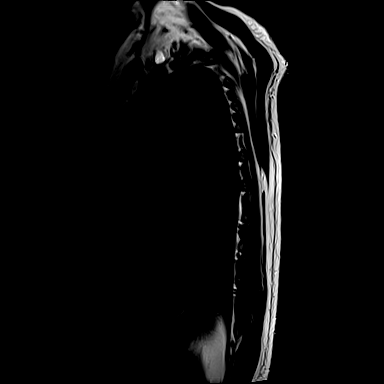

[Series 23: T1 fat-sat post-contrast · sagittal · 3.0mm · 1.06mm/px · 4 of 25 slices shown]
[im 1/25]
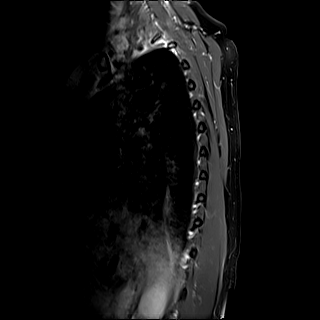
[im 9/25]
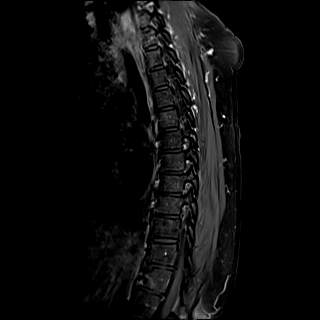
[im 17/25]
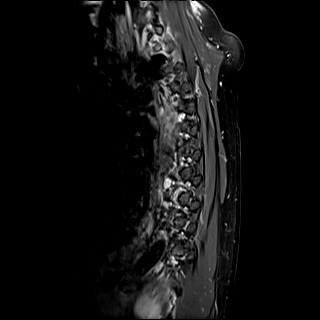
[im 25/25]
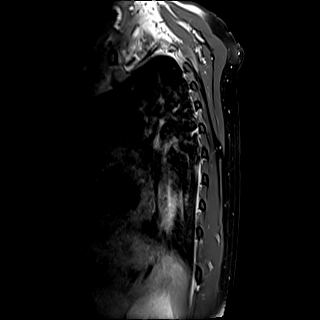

[Series 24: T1 post-contrast · axial · 4.0mm · 0.39mm/px · 1 of 46 slices shown]
[im 1/46]
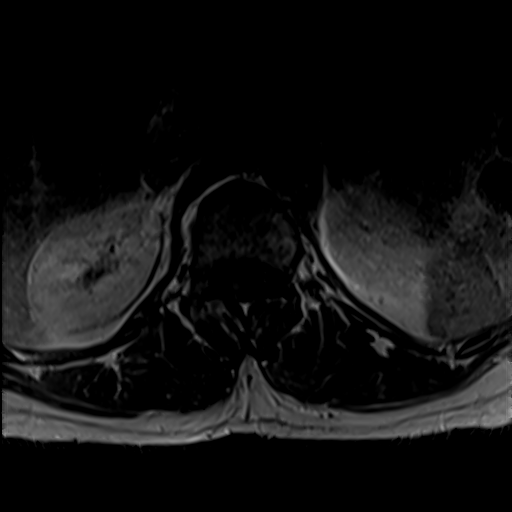

[33 of 48 positions shown; findings below may reference images not displayed]

FINDINGS: Alignment:  Normal

Vertebrae: Left laminotomy/foraminotomy at the level of T2/3. No
fracture or bone lesion. Expected granulation tissue

Cord:  Normal signal and morphology.

Paraspinal and other soft tissues: Significant decompression of the
large mass arising from the left T2-3 foramen. Two rounded areas of
residual tumor signal are seen in the left paraspinal T3-4 and T4-5
levels. A small tubular area of similar signal characteristics deep
to the medial border of the left trapezius is also redemonstrated.
No spinal canal mass is present. T2 hyperintensity at the left
supraclavicular fossa is related to the thoracic duct.

Disc levels:

T11-12 left paracentral protrusion and facet spurring with left
foraminal stenosis.
IMPRESSION: Resected upper left thoracic schwannoma with small remaining
components in the left extraforaminal space at T3-4, T4-5 and
beneath the left trapezius. No spinal canal mass.

## 2021-08-13 ENCOUNTER — Other Ambulatory Visit (HOSPITAL_COMMUNITY)
Admission: RE | Admit: 2021-08-13 | Discharge: 2021-08-13 | Disposition: A | Discharge status: Home / Self Care | Payer: No Typology Code available for payment source | Source: Ambulatory Visit | Attending: Nurse Practitioner | Admitting: Nurse Practitioner

## 2021-08-13 ENCOUNTER — Ambulatory Visit: Payer: No Typology Code available for payment source | Attending: Nurse Practitioner | Admitting: Nurse Practitioner

## 2021-08-13 ENCOUNTER — Encounter: Payer: Self-pay | Admitting: Nurse Practitioner

## 2021-08-13 VITALS — BP 122/82 | HR 64 | Ht 66.0 in | Wt 228.2 lb

## 2021-08-13 DIAGNOSIS — Z1211 Encounter for screening for malignant neoplasm of colon: Secondary | ICD-10-CM

## 2021-08-13 DIAGNOSIS — Z6835 Body mass index (BMI) 35.0-35.9, adult: Secondary | ICD-10-CM

## 2021-08-13 DIAGNOSIS — Z124 Encounter for screening for malignant neoplasm of cervix: Secondary | ICD-10-CM | POA: Diagnosis present

## 2021-08-13 DIAGNOSIS — Z114 Encounter for screening for human immunodeficiency virus [HIV]: Secondary | ICD-10-CM

## 2021-08-13 DIAGNOSIS — Z3042 Encounter for surveillance of injectable contraceptive: Secondary | ICD-10-CM

## 2021-08-13 DIAGNOSIS — E782 Mixed hyperlipidemia: Secondary | ICD-10-CM

## 2021-08-13 DIAGNOSIS — M25551 Pain in right hip: Secondary | ICD-10-CM

## 2021-08-13 MED ORDER — IBUPROFEN 800 MG PO TABS: 800.0000 mg | ORAL_TABLET | Freq: Three times a day (TID) | ORAL | 1 refills | Status: DC | PRN

## 2021-08-13 MED ORDER — METHOCARBAMOL 500 MG PO TABS: 500.0000 mg | ORAL_TABLET | Freq: Four times a day (QID) | ORAL | 0 refills | Status: AC

## 2021-08-13 MED ORDER — MEDROXYPROGESTERONE ACETATE 150 MG/ML IM SUSP
150.0000 mg | Freq: Once | INTRAMUSCULAR | Status: DC
  Administered 2021-08-13: 150 mg via INTRAMUSCULAR

## 2021-08-13 NOTE — Progress Notes (Signed)
Assessment & Plan:  Ardis was seen today for gynecologic exam.  Diagnoses and all orders for this visit:  Encounter for Papanicolaou smear for cervical cancer screening -     Cervicovaginal ancillary only -     Cytology - PAP  Colon cancer screening -     Ambulatory referral to Gastroenterology  Encounter for screening for HIV -     HIV antibody (with reflex)  Class 2 severe obesity due to excess calories with serious comorbidity and body mass index (BMI) of 36 Discussed diet and exercise for person with BMI >36. Instructed: You must burn more calories than you eat. Losing 5 percent of your body weight should be considered a success. In the longer term, losing more than 15 percent of your body weight and staying at this weight is an extremely good result. However, keep in mind that even losing 5 percent of your body weight leads to important health benefits, so try not to get discouraged if you're not able to lose more than this.    Encounter for surveillance of injectable contraceptive DEPO PROVERA NOT GIVEN TO THIS PATIENT TODAY Erroneous entry -     medroxyPROGESTERone (DEPO-PROVERA) injection 150 mg  Right hip pain -     ibuprofen (ADVIL) 800 MG tablet; Take 1 tablet (800 mg total) by mouth every 8 (eight) hours as needed. -     methocarbamol (ROBAXIN) 500 MG tablet; Take 1 tablet (500 mg total) by mouth 4 (four) times daily.  Mixed hyperlipidemia -     Lipid Panel    Patient has been counseled on age-appropriate routine health concerns for screening and prevention. These are reviewed and up-to-date. Referrals have been placed accordingly. Immunizations are up-to-date or declined.    Subjective:   Chief Complaint  Patient presents with   Gynecologic Exam   HPI Brittany Morgan 51 y.o. female presents to office today for pap smear. She has complaints of right hip pain after falling up the stairs in her home last week.  She was evaluated by Ortho several months ago for  right hip pain which was felt to be related to an inflammatory response from a previous fall.  At that time she was given a steroid taper and instructions were given for application of heat/ice and continued rest.    Review of Systems  Constitutional: Negative.  Negative for chills, fever, malaise/fatigue and weight loss.  Respiratory: Negative.  Negative for cough, shortness of breath and wheezing.   Cardiovascular: Negative.  Negative for chest pain, orthopnea and leg swelling.  Gastrointestinal:  Negative for abdominal pain.  Genitourinary: Negative.  Negative for flank pain.  Musculoskeletal:  Positive for joint pain.  Skin: Negative.  Negative for rash.  Psychiatric/Behavioral:  Negative for suicidal ideas. The patient is nervous/anxious.    Past Medical History:  Diagnosis Date   Anxiety    Dyspnea    on exertion   Hypertension     Past Surgical History:  Procedure Laterality Date   APPENDECTOMY     LAMINECTOMY Left 12/19/2019   Procedure: THORACIC LAMINECTOMY FOR TUMOR, LEFT THORACIC TWO- THORACIC THREE;  Surgeon: [name], MD;  Location: [facility];  Service: Neurosurgery;  Laterality: Left;   VIDEO ASSISTED THORACOSCOPY (VATS)/WEDGE RESECTION Left 12/19/2019   Procedure: VIDEO ASSISTED THORACOSCOPY (VATS)/RESECTION POSTERIOR MEDIASTINAL MASS WITH INTERCOSTAL NERVE BLOCK;  Surgeon: Grace Isaac, MD;  Location: [facility];  Service: Thoracic;  Laterality: Left;   VIDEO BRONCHOSCOPY N/A 12/19/2019   Procedure: VIDEO  BRONCHOSCOPY;  Surgeon: Grace Isaac, MD;  Location: [facility];  Service: Thoracic;  Laterality: N/A;    Family History  Problem Relation Age of Onset   Hyperlipidemia Mother    Heart disease Mother        AMI; s/p stenting   Stroke Father 61   Hypertension Father     Social History Reviewed with no changes to be made today.   Outpatient Medications Prior to Visit  Medication Sig Dispense Refill   albuterol (VENTOLIN HFA) 108 (90 Base) MCG/ACT  inhaler Inhale 1-2 puffs into the lungs every 6 (six) hours as needed for wheezing or shortness of breath. 8 g 2   amLODipine (NORVASC) 10 MG tablet Take 1 tablet (10 mg total) by mouth daily. 90 tablet 1   cyclobenzaprine (FLEXERIL) 10 MG tablet Take 1 tablet (10 mg total) by mouth 3 (three) times daily as needed for muscle spasms. 30 tablet 1   ferrous [id number] C-folic acid (TRINSICON / FOLTRIN) capsule Take 1 capsule by mouth 2 (two) times daily after a meal. 60 capsule 1   FLUoxetine (PROZAC) 20 MG tablet Take 2 tablets (40 mg total) by mouth daily. 180 tablet 1   gabapentin (NEURONTIN) 300 MG capsule Take 1 capsule (300 mg total) by mouth 3 (three) times daily. 90 capsule 0   hydrochlorothiazide (HYDRODIURIL) 25 MG tablet Take 1 tablet (25 mg total) by mouth daily. 90 tablet 1   lisinopril (ZESTRIL) 20 MG tablet Take 1 tablet (20 mg total) by mouth daily. 90 tablet 1   Multiple Vitamin (MULTIVITAMIN WITH MINERALS) TABS tablet Take 1 tablet by mouth daily.     pravastatin (PRAVACHOL) 20 MG tablet Take 1 tablet (20 mg total) by mouth daily. 90 tablet 1   ibuprofen (ADVIL) 800 MG tablet Take 1 tablet (800 mg total) by mouth every 8 (eight) hours as needed. 30 tablet 0   No facility-administered medications prior to visit.    No Known Allergies     Objective:    BP 122/82   Pulse 64   Ht '5\' 6"'$  (1.676 m)   Wt 228 lb 3.2 oz (103.5 kg)   LMP  (LMP Unknown)   SpO2 97%   BMI 36.83 kg/m  Wt Readings from Last 3 Encounters:  08/13/21 228 lb 3.2 oz (103.5 kg)  06/23/21 232 lb 6.4 oz (105.4 kg)  01/15/21 225 lb (102.1 kg)    Physical Exam Exam conducted with a chaperone present.  Constitutional:      Appearance: She is well-developed.  HENT:     Head: Normocephalic.  Cardiovascular:     Rate and Rhythm: Normal rate and regular rhythm.     Heart sounds: Normal heart sounds.  Pulmonary:     Effort: Pulmonary effort is normal.     Breath sounds: Normal breath sounds.   Abdominal:     General: Bowel sounds are normal.     Palpations: Abdomen is soft.     Hernia: There is no hernia in the left inguinal area.  Genitourinary:    Exam position: Lithotomy position.     Labia:        Right: No rash, tenderness, lesion or injury.        Left: No rash, tenderness, lesion or injury.      Vagina: Normal. No signs of injury and foreign body. No vaginal discharge, erythema, tenderness or bleeding.     Cervix: Normal.     Uterus: Not deviated and not enlarged.  Adnexa:        Right: No mass, tenderness or fullness.         Left: No mass, tenderness or fullness.       Rectum: Normal. No external hemorrhoid.  Lymphadenopathy:     Lower Body: No right inguinal adenopathy. No left inguinal adenopathy.  Skin:    General: Skin is warm and dry.  Neurological:     Mental Status: She is alert and oriented to person, place, and time.  Psychiatric:        Behavior: Behavior normal.        Thought Content: Thought content normal.        Judgment: Judgment normal.         Patient has been counseled extensively about nutrition and exercise as well as the importance of adherence with medications and regular follow-up. The patient was given clear instructions to go to ER or return to medical center if symptoms don't improve, worsen or new problems develop. The patient verbalized understanding.   Follow-up: Return in about 3 months (around 11/13/2021) for HTN.   Gildardo Pounds, FNP-BC [facility] and [facility] [place], [place]   08/13/2021, 1:12 PM

## 2021-08-13 NOTE — Patient Instructions (Signed)
[facility] [address] [place], [place] [address] Phone [phone] Fax [phone] 

## 2021-08-14 LAB — LIPID PANEL
Chol/HDL Ratio: 4.8 ratio — ABNORMAL HIGH (ref 0.0–4.4)
Cholesterol, Total: 380 mg/dL — ABNORMAL HIGH (ref 100–199)
HDL: 79 mg/dL (ref >39)
LDL Chol Calc (NIH): 270 mg/dL — ABNORMAL HIGH (ref 0–99)
Triglycerides: 163 mg/dL — ABNORMAL HIGH (ref 0–149)
VLDL Cholesterol Cal: 31 mg/dL (ref 5–40)

## 2021-08-14 LAB — CERVICOVAGINAL ANCILLARY ONLY
Bacterial Vaginitis (gardnerella): POSITIVE — AB
Candida Glabrata: NEGATIVE
Candida Vaginitis: NEGATIVE
Chlamydia: NEGATIVE
Comment: NEGATIVE
Comment: NEGATIVE
Comment: NEGATIVE
Comment: NEGATIVE
Comment: NEGATIVE
Comment: NORMAL
Neisseria Gonorrhea: NEGATIVE
Trichomonas: NEGATIVE

## 2021-08-14 LAB — CYTOLOGY - PAP
Comment: NEGATIVE
Diagnosis: NEGATIVE
High risk HPV: NEGATIVE

## 2021-08-14 LAB — HIV ANTIBODY (ROUTINE TESTING W REFLEX): HIV Screen 4th Generation wRfx: NONREACTIVE

## 2021-08-15 NOTE — Addendum Note (Signed)
Addended by: Gomez Cleverly on: 08/15/2021 12:29 PM   Modules accepted: Orders

## 2021-08-19 ENCOUNTER — Other Ambulatory Visit: Payer: Self-pay | Admitting: Nurse Practitioner

## 2021-08-19 MED ORDER — METRONIDAZOLE 500 MG PO TABS: 500.0000 mg | ORAL_TABLET | Freq: Two times a day (BID) | ORAL | 0 refills | Status: AC

## 2021-08-25 ENCOUNTER — Encounter: Payer: Self-pay | Admitting: Internal Medicine

## 2021-09-18 ENCOUNTER — Ambulatory Visit (AMBULATORY_SURGERY_CENTER): Payer: Self-pay | Admitting: *Deleted

## 2021-09-18 VITALS — Ht 66.0 in | Wt 223.0 lb

## 2021-09-18 DIAGNOSIS — Z1211 Encounter for screening for malignant neoplasm of colon: Secondary | ICD-10-CM

## 2021-09-18 MED ORDER — NA SULFATE-K SULFATE-MG SULF 17.5-3.13-1.6 GM/177ML PO SOLN: 2.0000 | Freq: Once | ORAL | 0 refills | Status: AC

## 2021-09-18 NOTE — Progress Notes (Signed)
No egg or soy allergy known to patient  No issues known to pt with past sedation with any surgeries or procedures Patient denies ever being told they had issues or difficulty with intubation  No FH of Malignant Hyperthermia Pt is not on diet pills Pt is not on  home 02  Pt is not on blood thinners  Pt denies issues with constipation  No A fib or A flutter  PV completed in person. Pt verified name, DOB.  Procedure explained to pt. Prep instructions reviewed, questions answered. Pt encouraged to call with questions or issues.  If pt has My chart, procedure instructions sent via My Chart    

## 2021-10-12 ENCOUNTER — Encounter: Payer: Self-pay | Admitting: Certified Registered Nurse Anesthetist

## 2021-10-14 ENCOUNTER — Encounter: Payer: Self-pay | Admitting: Internal Medicine

## 2021-10-16 ENCOUNTER — Ambulatory Visit (AMBULATORY_SURGERY_CENTER): Payer: No Typology Code available for payment source | Admitting: Internal Medicine

## 2021-10-16 ENCOUNTER — Encounter: Payer: Self-pay | Admitting: Internal Medicine

## 2021-10-16 VITALS — BP 100/70 | HR 65 | Temp 98.7°F | Resp 17 | Ht 66.0 in | Wt 223.0 lb

## 2021-10-16 DIAGNOSIS — Z1211 Encounter for screening for malignant neoplasm of colon: Secondary | ICD-10-CM | POA: Diagnosis present

## 2021-10-16 MED ORDER — SODIUM CHLORIDE 0.9 % IV SOLN: 500.0000 mL | Freq: Once | INTRAVENOUS | Status: DC

## 2021-10-16 NOTE — Progress Notes (Signed)
Report given to PACU, vss 

## 2021-10-16 NOTE — Op Note (Signed)
[facility] Patient Name: Brittany Morgan Procedure Date: 10/16/2021 11:12 AM MRN: [id number] Endoscopist: Sonny Masters "Brittany Morgan ,  Age: 51 Referring MD:  Date of Birth: November 19, 1970 Gender: Female Account #: [id number] Procedure:                Colonoscopy Indications:              Screening for colorectal malignant neoplasm, This                            is the patient's first colonoscopy Medicines:                Monitored Anesthesia Care Procedure:                Pre-Anesthesia Assessment:                           - Prior to the procedure, a History and Physical                            was performed, and patient medications and                            allergies were reviewed. The patient's tolerance of                            previous anesthesia was also reviewed. The risks                            and benefits of the procedure and the sedation                            options and risks were discussed with the patient.                            All questions were answered, and informed consent                            was obtained. Prior Anticoagulants: The patient has                            taken no previous anticoagulant or antiplatelet                            agents. ASA Grade Assessment: II - A patient with                            mild systemic disease. After reviewing the risks                            and benefits, the patient was deemed in                            satisfactory condition to undergo the procedure.  After obtaining informed consent, the colonoscope                            was passed under direct vision. Throughout the                            procedure, the patient's blood pressure, pulse, and                            oxygen saturations were monitored continuously. The                            Olympus CF-HQ190L [id number]) Colonoscope was                            introduced through the anus  and advanced to the the                            terminal ileum. The colonoscopy was performed                            without difficulty. The patient tolerated the                            procedure well. The quality of the bowel                            preparation was good. The terminal ileum, ileocecal                            valve, appendiceal orifice, and rectum were                            photographed. Scope In: 11:18:23 AM Scope Out: 11:33:09 AM Scope Withdrawal Time: 0 hours 11 minutes 38 seconds  Total Procedure Duration: 0 hours 14 minutes 46 seconds  Findings:                 The terminal ileum appeared normal.                           Non-bleeding internal hemorrhoids were found during                            retroflexion.                           The exam was otherwise without abnormality. Complications:            No immediate complications. Estimated Blood Loss:     Estimated blood loss: none. Impression:               - The examined portion of the ileum was normal.                           - Non-bleeding internal hemorrhoids.                           -  The examination was otherwise normal.                           - No specimens collected. Recommendation:           - Discharge patient to home (with escort).                           - Repeat colonoscopy in 10 years for screening                            purposes.                           - The findings and recommendations were discussed                            with the patient. Sonny Masters "Brittany Morgan,  10/16/2021 11:35:36 AM

## 2021-10-16 NOTE — Progress Notes (Signed)
Pt's states no medical or surgical changes since previsit or office visit. 

## 2021-10-16 NOTE — Progress Notes (Signed)
GASTROENTEROLOGY PROCEDURE H&P NOTE   Primary Care Physician: Gildardo Pounds, NP    Reason for Procedure:   Colon cancer screening  Plan:    Colonoscopy  Patient is appropriate for endoscopic procedure(s) in the ambulatory ([facility]) setting.  The nature of the procedure, as well as the risks, benefits, and alternatives were carefully and thoroughly reviewed with the patient. Ample time for discussion and questions allowed. The patient understood, was satisfied, and agreed to proceed.     HPI: Brittany Morgan is a 51 y.o. female who presents for colonoscopy for colon cancer screening. Denies blood in stools, changes in bowel habits, weight loss. Denies family history of colon cancer.  Past Medical History:  Diagnosis Date   Anemia    Anxiety    Dyspnea    on exertion   Hyperlipidemia    Hypertension     Past Surgical History:  Procedure Laterality Date   APPENDECTOMY     LAMINECTOMY Left 12/19/2019   Procedure: THORACIC LAMINECTOMY FOR TUMOR, LEFT THORACIC TWO- THORACIC THREE;  Surgeon: [name], MD;  Location: [facility];  Service: Neurosurgery;  Laterality: Left;   VIDEO ASSISTED THORACOSCOPY (VATS)/WEDGE RESECTION Left 12/19/2019   Procedure: VIDEO ASSISTED THORACOSCOPY (VATS)/RESECTION POSTERIOR MEDIASTINAL MASS WITH INTERCOSTAL NERVE BLOCK;  Surgeon: Grace Isaac, MD;  Location: [facility];  Service: Thoracic;  Laterality: Left;   VIDEO BRONCHOSCOPY N/A 12/19/2019   Procedure: VIDEO BRONCHOSCOPY;  Surgeon: Grace Isaac, MD;  Location: [facility];  Service: Thoracic;  Laterality: N/A;    Prior to Admission medications   Medication Sig Start Date End Date Taking? Authorizing Provider  amLODipine (NORVASC) 10 MG tablet Take 1 tablet (10 mg total) by mouth daily. 06/23/21 10/16/21 Yes Gildardo Pounds, NP  cyclobenzaprine (FLEXERIL) 10 MG tablet Take 1 tablet (10 mg total) by mouth 3 (three) times daily as needed for muscle spasms. 06/23/21  Yes Gildardo Pounds, NP   FLUoxetine (PROZAC) 20 MG tablet Take 2 tablets (40 mg total) by mouth daily. 06/23/21 10/16/21 Yes Gildardo Pounds, NP  gabapentin (NEURONTIN) 300 MG capsule Take 1 capsule (300 mg total) by mouth 3 (three) times daily. 02/19/20  Yes Grace Isaac, MD  hydrochlorothiazide (HYDRODIURIL) 25 MG tablet Take 1 tablet (25 mg total) by mouth daily. 06/23/21 10/16/21 Yes Gildardo Pounds, NP  lisinopril (ZESTRIL) 20 MG tablet Take 1 tablet (20 mg total) by mouth daily. 06/23/21 10/16/21 Yes Gildardo Pounds, NP  albuterol (VENTOLIN HFA) 108 (90 Base) MCG/ACT inhaler Inhale 1-2 puffs into the lungs every 6 (six) hours as needed for wheezing or shortness of breath. 02/21/20   Mayers, Cari S, PA-C  ferrous [id number] C-folic acid (TRINSICON / FOLTRIN) capsule Take 1 capsule by mouth 2 (two) times daily after a meal. Patient not taking: Reported on 09/18/2021 02/21/20   Mayers, Cari S, PA-C  ibuprofen (ADVIL) 800 MG tablet Take 1 tablet (800 mg total) by mouth every 8 (eight) hours as needed. 08/13/21   Gildardo Pounds, NP  methocarbamol (ROBAXIN) 500 MG tablet Take 1 tablet (500 mg total) by mouth 4 (four) times daily. Patient not taking: Reported on 09/18/2021 08/13/21   Gildardo Pounds, NP  Multiple Vitamin (MULTIVITAMIN WITH MINERALS) TABS tablet Take 1 tablet by mouth daily. Patient not taking: Reported on 09/18/2021    [provider]  pravastatin (PRAVACHOL) 20 MG tablet Take 1 tablet (20 mg total) by mouth daily. Patient not taking: Reported on 09/18/2021 06/23/21 09/21/21  Gildardo Pounds, NP  lisinopril-hydrochlorothiazide (PRINZIDE,ZESTORETIC) 20-12.5 MG tablet TAKE ONE TABLET BY MOUTH ONCE DAILY. Patient not taking: Reported on 10/27/2019 05/20/17 10/27/19  Vanessa Kick, MD    Current Outpatient Medications  Medication Sig Dispense Refill   amLODipine (NORVASC) 10 MG tablet Take 1 tablet (10 mg total) by mouth daily. 90 tablet 1   cyclobenzaprine (FLEXERIL) 10 MG tablet Take 1 tablet (10 mg  total) by mouth 3 (three) times daily as needed for muscle spasms. 30 tablet 1   FLUoxetine (PROZAC) 20 MG tablet Take 2 tablets (40 mg total) by mouth daily. 180 tablet 1   gabapentin (NEURONTIN) 300 MG capsule Take 1 capsule (300 mg total) by mouth 3 (three) times daily. 90 capsule 0   hydrochlorothiazide (HYDRODIURIL) 25 MG tablet Take 1 tablet (25 mg total) by mouth daily. 90 tablet 1   lisinopril (ZESTRIL) 20 MG tablet Take 1 tablet (20 mg total) by mouth daily. 90 tablet 1   albuterol (VENTOLIN HFA) 108 (90 Base) MCG/ACT inhaler Inhale 1-2 puffs into the lungs every 6 (six) hours as needed for wheezing or shortness of breath. 8 g 2   ferrous [id number] C-folic acid (TRINSICON / FOLTRIN) capsule Take 1 capsule by mouth 2 (two) times daily after a meal. (Patient not taking: Reported on 09/18/2021) 60 capsule 1   ibuprofen (ADVIL) 800 MG tablet Take 1 tablet (800 mg total) by mouth every 8 (eight) hours as needed. 60 tablet 1   methocarbamol (ROBAXIN) 500 MG tablet Take 1 tablet (500 mg total) by mouth 4 (four) times daily. (Patient not taking: Reported on 09/18/2021) 60 tablet 0   Multiple Vitamin (MULTIVITAMIN WITH MINERALS) TABS tablet Take 1 tablet by mouth daily. (Patient not taking: Reported on 09/18/2021)     pravastatin (PRAVACHOL) 20 MG tablet Take 1 tablet (20 mg total) by mouth daily. (Patient not taking: Reported on 09/18/2021) 90 tablet 1   Current Facility-Administered Medications  Medication Dose Route Frequency Provider Last Rate Last Admin   0.9 %  sodium chloride infusion  500 mL Intravenous Once Sharyn Creamer, MD        Allergies as of 10/16/2021   (No Known Allergies)    Family History  Problem Relation Age of Onset   Hyperlipidemia Mother    Heart disease Mother        AMI; s/p stenting   Colon polyps Father    Stroke Father 65   Hypertension Father    Colon cancer Neg Hx    Esophageal cancer Neg Hx    Stomach cancer Neg Hx    Rectal cancer Neg Hx      Social History   Socioeconomic History   Marital status: Married    Spouse name: Not on file   Number of children: Not on file   Years of education: Not on file   Highest education level: Not on file  Occupational History   Not on file  Tobacco Use   Smoking status: Never    Passive exposure: Yes   Smokeless tobacco: Never   Tobacco comments:    mother smokes- is frequently around mother.   Vaping Use   Vaping Use: Never used  Substance and Sexual Activity   Alcohol use: No    Alcohol/week: 0.0 standard drinks of alcohol   Drug use: No   Sexual activity: Yes    Birth control/protection: Surgical    Comment: Husband vasectomy  Other Topics Concern   Not on file  Social  History Narrative   Marital status: married      Children: 3 children (24, 91, 63); no grandchildren.      Employment:  Environmental consultant.      Tobacco:      Alcohol:      Exercise:         Social Determinants of Radio broadcast assistant Strain: Not on file  Food Insecurity: Not on file  Transportation Needs: Not on file  Physical Activity: Not on file  Stress: Not on file  Social Connections: Not on file  Intimate Partner Violence: Not on file    Physical Exam: Vital signs in last 24 hours: BP 116/65   Pulse 92   Temp 98.7 F (37.1 C)   Ht '5\' 6"'$  (1.676 m)   Wt 223 lb (101.2 kg)   LMP  (LMP Unknown)   SpO2 98%   BMI 35.99 kg/m  GEN: NAD EYE: Sclerae anicteric ENT: MMM CV: Non-tachycardic Pulm: No increased work of breathing GI: Soft, NT/ND NEURO:  Alert & Oriented   [name], MD [facility]  10/16/2021 10:46 AM

## 2021-10-16 NOTE — Patient Instructions (Signed)
YOU HAD AN ENDOSCOPIC PROCEDURE TODAY AT THE [facility]:   Refer to the procedure report that was given to you for any specific questions about what was found during the examination.  If the procedure report does not answer your questions, please call your gastroenterologist to clarify.  If you requested that your care partner not be given the details of your procedure findings, then the procedure report has been included in a sealed envelope for you to review at your convenience later.  YOU SHOULD EXPECT: Some feelings of bloating in the abdomen. Passage of more gas than usual.  Walking can help get rid of the air that was put into your GI tract during the procedure and reduce the bloating. If you had a lower endoscopy (such as a colonoscopy or flexible sigmoidoscopy) you may notice spotting of blood in your stool or on the toilet paper. If you underwent a bowel prep for your procedure, you may not have a normal bowel movement for a few days.  Please Note:  You might notice some irritation and congestion in your nose or some drainage.  This is from the oxygen used during your procedure.  There is no need for concern and it should clear up in a day or so.  SYMPTOMS TO REPORT IMMEDIATELY:  Following lower endoscopy (colonoscopy or flexible sigmoidoscopy):  Excessive amounts of blood in the stool  Significant tenderness or worsening of abdominal pains  Swelling of the abdomen that is new, acute  Fever of 100F or higher  For urgent or emergent issues, a gastroenterologist can be reached at any hour by calling [phone]. Do not use MyChart messaging for urgent concerns.    DIET:  We do recommend a small meal at first, but then you may proceed to your regular diet.  Drink plenty of fluids but you should avoid alcoholic beverages for 24 hours.  ACTIVITY:  You should plan to take it easy for the rest of today and you should NOT DRIVE or use heavy machinery until tomorrow (because of  the sedation medicines used during the test).    FOLLOW UP: Our staff will call the number listed on your records the next business day following your procedure.  We will call around 7:15- 8:00 am to check on you and address any questions or concerns that you may have regarding the information given to you following your procedure. If we do not reach you, we will leave a message.  If you develop any symptoms (ie: fever, flu-like symptoms, shortness of breath, cough etc.) before then, please call [phone].  If you test positive for Covid 19 in the 2 weeks post procedure, please call and report this information to us.    If any biopsies were taken you will be contacted by phone or by letter within the next 1-3 weeks.  Please call us at [phone] if you have not heard about the biopsies in 3 weeks.    SIGNATURES/CONFIDENTIALITY: You and/or your care partner have signed paperwork which will be entered into your electronic medical record.  These signatures attest to the fact that that the information above on your After Visit Summary has been reviewed and is understood.  Full responsibility of the confidentiality of this discharge information lies with you and/or your care-partner.  

## 2021-10-17 ENCOUNTER — Telehealth: Payer: Self-pay | Admitting: *Deleted

## 2021-10-17 NOTE — Telephone Encounter (Signed)
  Follow up Call-     10/16/2021   10:36 AM  Call back number  Post procedure Call Back phone  # [phone]  Permission to leave phone message Yes   LMOM to call back with any questions or concerns.

## 2021-11-14 ENCOUNTER — Ambulatory Visit: Payer: No Typology Code available for payment source | Admitting: Nurse Practitioner

## 2021-12-21 ENCOUNTER — Other Ambulatory Visit: Payer: Self-pay | Admitting: Nurse Practitioner

## 2021-12-21 DIAGNOSIS — I10 Essential (primary) hypertension: Secondary | ICD-10-CM

## 2022-02-27 ENCOUNTER — Other Ambulatory Visit: Payer: Self-pay | Admitting: Nurse Practitioner

## 2022-02-27 ENCOUNTER — Telehealth: Payer: Self-pay | Admitting: Family Medicine

## 2022-02-27 DIAGNOSIS — I10 Essential (primary) hypertension: Secondary | ICD-10-CM

## 2022-02-27 DIAGNOSIS — F32A Depression, unspecified: Secondary | ICD-10-CM

## 2022-02-27 NOTE — Telephone Encounter (Signed)
Requested medications are due for refill today.  yes  Requested medications are on the active medications list.  yes  Last refill. All 3 refilled 06/23/2021 with 6 months supply.  Future visit scheduled.   no  Notes to clinic.  Rx's written to expire 10/16/2021 - Rx's are expired.    Requested Prescriptions  Pending Prescriptions Disp Refills   FLUoxetine (PROZAC) 20 MG tablet [Pharmacy Med Name: FLUoxetine HCl 20 MG Oral Tablet] 180 tablet 0    Sig: Take 2 tablets by mouth once daily     Psychiatry:  Antidepressants - SSRI Failed - 02/27/2022  1:02 PM      Failed - Valid encounter within last 6 months    Recent Outpatient Visits           6 months ago Encounter for Papanicolaou smear for cervical cancer screening   [place], Vernia Buff, NP   8 months ago Essential hypertension   [place], Vernia Buff, NP   1 year ago Acute right hip pain   [place] Mayers, Cari S, [place]   1 year ago Encounter to establish care   [place], [place] W, NP   2 years ago Essential hypertension   [place], Cari S, [place]              Passed - Completed PHQ-2 or PHQ-9 in the last 360 days       amLODipine (NORVASC) 10 MG tablet [Pharmacy Med Name: amLODIPine Besylate 10 MG Oral Tablet] 90 tablet 0    Sig: Take 1 tablet by mouth once daily     Cardiovascular: Calcium Channel Blockers 2 Failed - 02/27/2022  1:02 PM      Failed - Valid encounter within last 6 months    Recent Outpatient Visits           6 months ago Encounter for Papanicolaou smear for cervical cancer screening   [place], Vernia Buff, NP   8 months ago Essential hypertension   [place], Vernia Buff, NP   1 year ago Acute right hip pain   [place] Mayers, Cari S, [place]   1 year ago Encounter to establish care   [place], [place] W, NP   2 years ago Essential hypertension   [place] Mayers, Cari S, [place]              Passed - Last BP in normal range    BP Readings from Last 1 Encounters:  10/16/21 100/70         Passed - Last Heart Rate in normal range    Pulse Readings from Last 1 Encounters:  10/16/21 65          hydrochlorothiazide (HYDRODIURIL) 25 MG tablet [Pharmacy Med Name: hydroCHLOROthiazide 25 MG Oral Tablet] 90 tablet 0    Sig: Take 1 tablet by mouth once daily     Cardiovascular: Diuretics - Thiazide Failed - 02/27/2022  1:02 PM      Failed - Cr in normal range and within 180 days    Creat  Date Value Ref Range Status  06/14/2013 1.19 (H) 0.50 - 1.10 mg/dL Final   Creatinine, Ser  Date Value Ref Range Status  06/23/2021 1.04 (H) 0.57 - 1.00 mg/dL Final         Failed - K in normal range and within 180 days    Potassium  Date Value Ref Range Status  06/23/2021 4.2 3.5 - 5.2 mmol/L Final         Failed - Na in normal range and within 180 days    Sodium  Date Value Ref Range Status  06/23/2021 141 134 - 144 mmol/L Final         Failed - Valid encounter within last 6 months    Recent Outpatient Visits           6 months ago Encounter for Papanicolaou smear for cervical cancer screening   [place], Vernia Buff, NP   8 months ago Essential hypertension   [place], Vernia Buff, NP   1 year ago Acute right hip pain   [place], [place]   1 year ago Encounter to establish care   [place], [place] W, NP   2 years ago Essential hypertension   [place], [place]              Passed - Last BP in normal range    BP  Readings from Last 1 Encounters:  10/16/21 100/70

## 2022-02-27 NOTE — Telephone Encounter (Signed)
Requested medications are due for refill today.  yes  Requested medications are on the active medications list.  yes  Last refill. 12/22/2021 #30 3GD  Future visit scheduled.   no  Notes to clinic.  Pt is overdue for OV. Pt already given a courtesy refill.    Requested Prescriptions  Pending Prescriptions Disp Refills   lisinopril (ZESTRIL) 20 MG tablet [Pharmacy Med Name: Lisinopril 20 MG Oral Tablet] 30 tablet 0    Sig: TAKE 1 TABLET BY MOUTH ONCE DAILY . APPOINTMENT REQUIRED FOR FUTURE REFILLS     Cardiovascular:  ACE Inhibitors Failed - 02/27/2022  1:01 PM      Failed - Cr in normal range and within 180 days    Creat  Date Value Ref Range Status  06/14/2013 1.19 (H) 0.50 - 1.10 mg/dL Final   Creatinine, Ser  Date Value Ref Range Status  06/23/2021 1.04 (H) 0.57 - 1.00 mg/dL Final         Failed - K in normal range and within 180 days    Potassium  Date Value Ref Range Status  06/23/2021 4.2 3.5 - 5.2 mmol/L Final         Failed - Valid encounter within last 6 months    Recent Outpatient Visits           6 months ago Encounter for Papanicolaou smear for cervical cancer screening   [place], Vernia Buff, NP   8 months ago Essential hypertension   [place], Vernia Buff, NP   1 year ago Acute right hip pain   [place] Mayers, [place], [place]   1 year ago Encounter to establish care   [place], [place] W, NP   2 years ago Essential hypertension   [place], [place] - Patient is not pregnant      Passed - Last BP in normal range    BP Readings from Last 1 Encounters:  10/16/21 100/70

## 2022-03-05 ENCOUNTER — Other Ambulatory Visit: Payer: Self-pay | Admitting: Family Medicine

## 2022-03-05 DIAGNOSIS — I10 Essential (primary) hypertension: Secondary | ICD-10-CM

## 2022-03-05 MED ORDER — HYDROCHLOROTHIAZIDE 25 MG PO TABS: 25.0000 mg | ORAL_TABLET | Freq: Every day | ORAL | 0 refills | Status: DC

## 2022-03-05 MED ORDER — AMLODIPINE BESYLATE 10 MG PO TABS: 10.0000 mg | ORAL_TABLET | Freq: Every day | ORAL | 0 refills | Status: DC

## 2022-03-05 MED ORDER — FLUOXETINE HCL 20 MG PO TABS: 40.0000 mg | ORAL_TABLET | Freq: Every day | ORAL | 0 refills | Status: DC

## 2022-03-05 NOTE — Telephone Encounter (Signed)
Courtesy refill given until upcoming appointment.   Requested Prescriptions  Pending Prescriptions Disp Refills   FLUoxetine (PROZAC) 20 MG tablet 70 tablet 0    Sig: Take 2 tablets (40 mg total) by mouth daily. OFFICE VISIT NEEDED FOR ADDITIONAL REFILLS     Psychiatry:  Antidepressants - SSRI Failed - 03/05/2022  2:02 PM      Failed - Valid encounter within last 6 months    Recent Outpatient Visits           6 months ago Encounter for Papanicolaou smear for cervical cancer screening   [place], Vernia Buff, NP   8 months ago Essential hypertension   [place], Vernia Buff, NP   1 year ago Acute right hip pain   [place], [place]   1 year ago Encounter to establish care   [place], Vernia Buff, NP   2 years ago Essential hypertension   [place], [place], PA-C       Future Appointments             In 1 month [place], Dionne Bucy, PA-C [place] - Completed PHQ-2 or PHQ-9 in the last 360 days       hydrochlorothiazide (HYDRODIURIL) 25 MG tablet 35 tablet 0    Sig: Take 1 tablet (25 mg total) by mouth daily. OFFICE VISIT NEEDED FOR ADDITIONAL REFILLS     Cardiovascular: Diuretics - Thiazide Failed - 03/05/2022  2:02 PM      Failed - Cr in normal range and within 180 days    Creat  Date Value Ref Range Status  06/14/2013 1.19 (H) 0.50 - 1.10 mg/dL Final   Creatinine, Ser  Date Value Ref Range Status  06/23/2021 1.04 (H) 0.57 - 1.00 mg/dL Final         Failed - K in normal range and within 180 days    Potassium  Date Value Ref Range Status  06/23/2021 4.2 3.5 - 5.2 mmol/L Final         Failed - Na in normal range and within 180 days    Sodium  Date Value Ref Range Status  06/23/2021 141 134 - 144 mmol/L Final          Failed - Valid encounter within last 6 months    Recent Outpatient Visits           6 months ago Encounter for Papanicolaou smear for cervical cancer screening   [place], Vernia Buff, NP   8 months ago Essential hypertension   [place], Vernia Buff, NP   1 year ago Acute right hip pain   [place], [place], [place]   1 year ago Encounter to establish care   [place], Vernia Buff, NP   2 years ago Essential hypertension   [place], [place], PA-C       Future Appointments             In 1 month [place], Dionne Bucy, PA-C [place] -  Last BP in normal range    BP Readings from Last 1 Encounters:  10/16/21 100/70          amLODipine (NORVASC) 10 MG tablet 35 tablet 0    Sig: Take 1 tablet (10 mg total) by mouth daily. OFFICE VISIT NEEDED FOR ADDITIONAL REFILLS     Cardiovascular: Calcium Channel Blockers 2 Failed - 03/05/2022  2:02 PM      Failed - Valid encounter within last 6 months    Recent Outpatient Visits           6 months ago Encounter for Papanicolaou smear for cervical cancer screening   [place], Vernia Buff, NP   8 months ago Essential hypertension   [place], Vernia Buff, NP   1 year ago Acute right hip pain   [place], [place]   1 year ago Encounter to establish care   [place], Vernia Buff, NP   2 years ago Essential hypertension   [place], [place], PA-C       Future Appointments             In 1 month [place], PA-C [place] BP in normal range    BP  Readings from Last 1 Encounters:  10/16/21 100/70         Passed - Last Heart Rate in normal range    Pulse Readings from Last 1 Encounters:  10/16/21 65         Refused Prescriptions Disp Refills   FLUoxetine (PROZAC) 20 MG tablet [Pharmacy Med Name: FLUoxetine HCl 20 MG Oral Tablet] 180 tablet 0    Sig: Take 2 tablets by mouth once daily     Psychiatry:  Antidepressants - SSRI Failed - 03/05/2022  2:02 PM      Failed - Valid encounter within last 6 months    Recent Outpatient Visits           6 months ago Encounter for Papanicolaou smear for cervical cancer screening   [place], Vernia Buff, NP   8 months ago Essential hypertension   [place], Vernia Buff, NP   1 year ago Acute right hip pain   [place], Cari S, [place]   1 year ago Encounter to establish care   [place], [place] W, NP   2 years ago Essential hypertension   [place], Cari S, PA-C       Future Appointments             In 1 month [place], Dionne Bucy, PA-C [place] - Completed PHQ-2 or PHQ-9 in the last 360 days       amLODipine (NORVASC) 10 MG tablet [Pharmacy Med Name: amLODIPine Besylate 10 MG Oral Tablet] 90 tablet 0    Sig: Take 1 tablet by mouth once daily     Cardiovascular: Calcium Channel Blockers 2 Failed - 03/05/2022  2:02 PM      Failed - Valid encounter within last 6 months  Recent Outpatient Visits           6 months ago Encounter for Papanicolaou smear for cervical cancer screening   [place], [place], NP   8 months ago Essential hypertension   [place], Vernia Buff, NP   1 year ago Acute right hip pain   [place],  [place]   1 year ago Encounter to establish care   [place], Vernia Buff, NP   2 years ago Essential hypertension   [place], Cari S, PA-C       Future Appointments             In 1 month [place], Dionne Bucy, PA-C [place] BP in normal range    BP Readings from Last 1 Encounters:  10/16/21 100/70         Passed - Last Heart Rate in normal range    Pulse Readings from Last 1 Encounters:  10/16/21 65          hydrochlorothiazide (HYDRODIURIL) 25 MG tablet [Pharmacy Med Name: hydroCHLOROthiazide 25 MG Oral Tablet] 90 tablet 0    Sig: Take 1 tablet by mouth once daily     Cardiovascular: Diuretics - Thiazide Failed - 03/05/2022  2:02 PM      Failed - Cr in normal range and within 180 days    Creat  Date Value Ref Range Status  06/14/2013 1.19 (H) 0.50 - 1.10 mg/dL Final   Creatinine, Ser  Date Value Ref Range Status  06/23/2021 1.04 (H) 0.57 - 1.00 mg/dL Final         Failed - K in normal range and within 180 days    Potassium  Date Value Ref Range Status  06/23/2021 4.2 3.5 - 5.2 mmol/L Final         Failed - Na in normal range and within 180 days    Sodium  Date Value Ref Range Status  06/23/2021 141 134 - 144 mmol/L Final         Failed - Valid encounter within last 6 months    Recent Outpatient Visits           6 months ago Encounter for Papanicolaou smear for cervical cancer screening   [place], Vernia Buff, NP   8 months ago Essential hypertension   [place], Vernia Buff, NP   1 year ago Acute right hip pain   [place], [place]   1 year ago Encounter to establish care   [place], Vernia Buff, NP   2 years ago Essential hypertension   [place], PA-C       Future Appointments             In 1 month [place], Dionne Bucy, PA-C [place] BP in normal range    BP Readings from Last 1 Encounters:  10/16/21 100/70

## 2022-03-05 NOTE — Addendum Note (Signed)
Addended by: Durwin Nora on: 03/05/2022 02:02 PM   Modules accepted: Orders

## 2022-03-05 NOTE — Telephone Encounter (Signed)
Pt has an appt scheduled for 04-08-2022  Pt states she is out of medication

## 2022-04-08 ENCOUNTER — Ambulatory Visit: Payer: Self-pay | Attending: Physician Assistant | Admitting: Physician Assistant

## 2022-04-08 ENCOUNTER — Encounter: Payer: Self-pay | Admitting: Physician Assistant

## 2022-04-08 VITALS — BP 138/98 | HR 82 | Ht 66.0 in | Wt 224.2 lb

## 2022-04-08 DIAGNOSIS — Z6835 Body mass index (BMI) 35.0-35.9, adult: Secondary | ICD-10-CM

## 2022-04-08 DIAGNOSIS — M25551 Pain in right hip: Secondary | ICD-10-CM

## 2022-04-08 DIAGNOSIS — F32A Depression, unspecified: Secondary | ICD-10-CM

## 2022-04-08 DIAGNOSIS — E782 Mixed hyperlipidemia: Secondary | ICD-10-CM

## 2022-04-08 DIAGNOSIS — Z23 Encounter for immunization: Secondary | ICD-10-CM

## 2022-04-08 DIAGNOSIS — I10 Essential (primary) hypertension: Secondary | ICD-10-CM

## 2022-04-08 MED ORDER — IBUPROFEN 800 MG PO TABS: 800.0000 mg | ORAL_TABLET | Freq: Three times a day (TID) | ORAL | 1 refills | Status: AC | PRN

## 2022-04-08 MED ORDER — LISINOPRIL 20 MG PO TABS: ORAL_TABLET | ORAL | 1 refills | Status: DC

## 2022-04-08 MED ORDER — AMLODIPINE BESYLATE 10 MG PO TABS: 10.0000 mg | ORAL_TABLET | Freq: Every day | ORAL | 1 refills | Status: DC

## 2022-04-08 MED ORDER — HYDROCHLOROTHIAZIDE 25 MG PO TABS: 25.0000 mg | ORAL_TABLET | Freq: Every day | ORAL | 1 refills | Status: DC

## 2022-04-08 MED ORDER — FLUOXETINE HCL 20 MG PO TABS: 40.0000 mg | ORAL_TABLET | Freq: Every day | ORAL | 1 refills | Status: AC

## 2022-04-08 MED ORDER — PRAVASTATIN SODIUM 20 MG PO TABS: 20.0000 mg | ORAL_TABLET | Freq: Every day | ORAL | 1 refills | Status: DC

## 2022-04-08 NOTE — Progress Notes (Signed)
Patient ID: Brittany Morgan, female   DOB: [birth year], 52 y.o.   MRN: [id number]   Aalaysia Liggins, is a 52 y.o. female  KZL:[id number]  [id number]  DOB - January 25, 1971  Chief Complaint  Patient presents with   Medication Refill   Hip Pain       Subjective:   Brittany Morgan is a 52 y.o. female here today for med RF.  She continues having R hip pain after sustaining a fall about 1.5 yrs ago.  She recently tripped and it seemed to re-trigger her R hip pain.  Xrays done previously.  Pain is worse upon first getting up in the morning and typically resolves as soon as she starts moving around.  Occasionally bothers her at other times.    She is interested in weight loss.  She would like a medications.  She is open to nutrition counseling.    She has been out of BP and depression meds for about 3 days.  She never checks her BP out of the office unless she is upset in which case it is always high.  She denies HA/CP/SOB/dizziness.  No longer taking gabapentin.  She never picked up pravastatin.  Cholesterol is very high.      No problems updated.  ALLERGIES: No Known Allergies  PAST MEDICAL HISTORY: Past Medical History:  Diagnosis Date   Anemia    Anxiety    Dyspnea    on exertion   Hyperlipidemia    Hypertension     MEDICATIONS AT HOME: Prior to Admission medications   Medication Sig Start Date End Date Taking? Authorizing Provider  albuterol (VENTOLIN HFA) 108 (90 Base) MCG/ACT inhaler Inhale 1-2 puffs into the lungs every 6 (six) hours as needed for wheezing or shortness of breath. 02/21/20  Yes Mayers, Cari S, PA-C  cyclobenzaprine (FLEXERIL) 10 MG tablet Take 1 tablet (10 mg total) by mouth 3 (three) times daily as needed for muscle spasms. 06/23/21  Yes Gildardo Pounds, NP  ferrous [id number] C-folic acid (TRINSICON / FOLTRIN) capsule Take 1 capsule by mouth 2 (two) times daily after a meal. 02/21/20  Yes Mayers, Cari S, PA-C  methocarbamol (ROBAXIN) 500 MG tablet Take  1 tablet (500 mg total) by mouth 4 (four) times daily. 08/13/21  Yes Gildardo Pounds, NP  Multiple Vitamin (MULTIVITAMIN WITH MINERALS) TABS tablet Take 1 tablet by mouth daily.   Yes [provider]  amLODipine (NORVASC) 10 MG tablet Take 1 tablet (10 mg total) by mouth daily. 04/08/22   Argentina Donovan, PA-C  FLUoxetine (PROZAC) 20 MG tablet Take 2 tablets (40 mg total) by mouth daily. 04/08/22 07/07/22  Argentina Donovan, PA-C  hydrochlorothiazide (HYDRODIURIL) 25 MG tablet Take 1 tablet (25 mg total) by mouth daily. 04/08/22   Argentina Donovan, PA-C  ibuprofen (ADVIL) 800 MG tablet Take 1 tablet (800 mg total) by mouth every 8 (eight) hours as needed. 04/08/22   Argentina Donovan, PA-C  lisinopril (ZESTRIL) 20 MG tablet TAKE 1 TABLET BY MOUTH ONCE DAILY . 04/08/22   Argentina Donovan, PA-C  pravastatin (PRAVACHOL) 20 MG tablet Take 1 tablet (20 mg total) by mouth daily. 04/08/22 07/07/22  Argentina Donovan, PA-C  lisinopril-hydrochlorothiazide (PRINZIDE,ZESTORETIC) 20-12.5 MG tablet TAKE ONE TABLET BY MOUTH ONCE DAILY. Patient not taking: Reported on 10/27/2019 05/20/17 10/27/19  Vanessa Kick, MD    ROS: Neg HEENT Neg resp Neg cardiac Neg GI Neg GU Neg psych Neg neuro  Objective:   Vitals:  04/08/22 0856 04/08/22 0918  BP: (!) 140/100 (!) 138/98  Pulse: 82   SpO2: 100%   Weight: 224 lb 3.2 oz (101.7 kg)   Height: '5\' 6"'$  (1.676 m)    Exam General appearance : Awake, alert, not in any distress. Speech Clear. Not toxic looking HEENT: Atraumatic and Normocephalic Neck: Supple, no JVD. No cervical lymphadenopathy.  Chest: Good air entry bilaterally, CTAB.  No rales/rhonchi/wheezing CVS: S1 S2 regular, no murmurs.  Normal gait.   Extremities: B/L Lower Ext shows no edema, both legs are warm to touch Neurology: Awake alert, and oriented X 3, CN II-XII intact, Non focal Skin: No Rash  Data Review No results found for: "HGBA1C"  Assessment & Plan   1. Essential  hypertension Out of meds.  Resume meds.  Check BP daily and record-goal <130/<85.  Labs deferred - Lipid panel; Future - hydrochlorothiazide (HYDRODIURIL) 25 MG tablet; Take 1 tablet (25 mg total) by mouth daily.  Dispense: 90 tablet; Refill: 1 - lisinopril (ZESTRIL) 20 MG tablet; TAKE 1 TABLET BY MOUTH ONCE DAILY .  Dispense: 90 tablet; Refill: 1 - amLODipine (NORVASC) 10 MG tablet; Take 1 tablet (10 mg total) by mouth daily.  Dispense: 90 tablet; Refill: 1 - Comprehensive metabolic panel; Future  2. Depressive disorder stable - FLUoxetine (PROZAC) 20 MG tablet; Take 2 tablets (40 mg total) by mouth daily.  Dispense: 180 tablet; Refill: 1  3. Mixed hyperlipidemia Labs deferred bc she was not taking pravastatin and out of other meds several days.  Check labs in about 5-6 weeks on meds - Lipid panel; Future - pravastatin (PRAVACHOL) 20 MG tablet; Take 1 tablet (20 mg total) by mouth daily.  Dispense: 90 tablet; Refill: 1 - Comprehensive metabolic panel; Future  4. Right hip pain For prn use - ibuprofen (ADVIL) 800 MG tablet; Take 1 tablet (800 mg total) by mouth every 8 (eight) hours as needed.  Dispense: 60 tablet; Refill: 1  5. Class 2 severe obesity due to excess calories with serious comorbidity and body mass index (BMI) of 35.0 to 35.9 in adult [facility]) - Amb Ref to Medical Weight Management    Return for [facility] and labs in 5-6 weeks for blood pressure.  The patient was given clear instructions to go to ER or return to medical center if symptoms don't improve, worsen or new problems develop. The patient verbalized understanding. The patient was told to call to get lab results if they haven't heard anything in the next week.      Freeman Caldron, PA-C [facility] and [facility] [place], [place]   04/08/2022, 9:18 AM

## 2022-04-08 NOTE — Patient Instructions (Addendum)
<  130/<85  is the goal for your blood pressure

## 2022-05-20 NOTE — Progress Notes (Deleted)
   S:     PCP: Geryl Rankins  52 y.o. female who presents for hypertension evaluation, education, and management. PMH is significant for HTN, depression, and obesity.   Patient was referred and last seen by Primary Care Provider, Freeman Caldron, on 04/08/2022. At last visit, BP was elevated at 140/100 and 138/98 mmHg. She had been out of her medications for ~ 3 days at that time.  Today, patient arrives in *** spirits and presents without *** assistance. *** Denies dizziness, headache, blurred vision, swelling.   Patient reports hypertension was diagnosed in ***.   Family/Social history:  -Fhx: HLD, CVD, CVA, HTN -Tobacco:denies -Alcohol: denies  Medication adherence *** . Patient has *** taken BP medications today.   Current antihypertensives include: amlodipine 10 mg once daily, HCTZ 25 mg once daily, lisinopril 20 mg once daily  Antihypertensives tried in the past include: ***  Reported home BP readings: ***  Patient reported dietary habits: Eats *** meals/day Breakfast: *** Lunch: *** Dinner: *** Snacks: *** Drinks: ***  Patient-reported exercise habits: ***  ASCVD risk factors include: ***  O:  ROS  Physical Exam  Last 3 Office BP readings: BP Readings from Last 3 Encounters:  04/08/22 (!) 138/98  10/16/21 100/70  08/13/21 122/82    BMET    Component Value Date/Time   NA 141 06/23/2021 1427   K 4.2 06/23/2021 1427   CL 101 06/23/2021 1427   CO2 26 06/23/2021 1427   GLUCOSE 99 06/23/2021 1427   GLUCOSE 104 (H) 12/21/2019 0412   BUN 11 06/23/2021 1427   CREATININE 1.04 (H) 06/23/2021 1427   CREATININE 1.19 (H) 06/14/2013 2003   CALCIUM 10.0 06/23/2021 1427   GFRNONAA 64 02/21/2020 1451   GFRAA 74 02/21/2020 1451    Renal function: CrCl cannot be calculated (Patient's most recent lab result is older than the maximum 21 days allowed.).  Clinical ASCVD: {YES/NO:21197} The ASCVD Risk score (Arnett DK, et al., 2019) failed to calculate for the  following reasons:   The valid total cholesterol range is 130 to 320 mg/dL  Patient is participating in a Managed Medicaid Plan:  {MM YES/NO:27447::"Yes"}    A/P: Hypertension diagnosed *** currently *** on current medications. BP goal < 130/80 *** mmHg. Medication adherence appears ***. Control is suboptimal due to ***.  -{Meds adjust:18428} ***.  -{Meds adjust:18428} ***.  -Patient educated on purpose, proper use, and potential adverse effects of ***.  -F/u labs ordered - *** -Counseled on lifestyle modifications for blood pressure control including reduced dietary sodium, increased exercise, adequate sleep. -Encouraged patient to check BP at home and bring log of readings to next visit. Counseled on proper use of home BP cuff.   Results reviewed and written information provided.    Written patient instructions provided. Patient verbalized understanding of treatment plan.  Total time in face to face counseling *** minutes.    Follow-up:  Pharmacist ***.  Maryan Puls, PharmD PGY-1 [facility] [place] [facility] Pharmacy Resident

## 2022-05-21 ENCOUNTER — Ambulatory Visit: Payer: Self-pay | Attending: Pharmacist | Admitting: Pharmacist

## 2022-10-01 ENCOUNTER — Other Ambulatory Visit: Payer: Self-pay | Admitting: Physician Assistant

## 2022-10-01 DIAGNOSIS — I10 Essential (primary) hypertension: Secondary | ICD-10-CM

## 2022-10-01 DIAGNOSIS — E782 Mixed hyperlipidemia: Secondary | ICD-10-CM

## 2022-10-28 ENCOUNTER — Other Ambulatory Visit: Payer: Self-pay | Admitting: Family Medicine

## 2022-10-28 DIAGNOSIS — I10 Essential (primary) hypertension: Secondary | ICD-10-CM

## 2023-02-02 ENCOUNTER — Other Ambulatory Visit: Payer: Self-pay | Admitting: Family Medicine

## 2023-02-02 ENCOUNTER — Other Ambulatory Visit: Payer: Self-pay | Admitting: Physician Assistant

## 2023-02-02 DIAGNOSIS — F32A Depression, unspecified: Secondary | ICD-10-CM

## 2023-02-02 DIAGNOSIS — E782 Mixed hyperlipidemia: Secondary | ICD-10-CM

## 2023-02-02 DIAGNOSIS — I10 Essential (primary) hypertension: Secondary | ICD-10-CM

## 2023-06-30 ENCOUNTER — Other Ambulatory Visit: Payer: Self-pay | Admitting: Nurse Practitioner

## 2023-06-30 DIAGNOSIS — I10 Essential (primary) hypertension: Secondary | ICD-10-CM

## 2023-06-30 NOTE — Telephone Encounter (Unsigned)
 Copied from CRM [id number]. Topic: Clinical - Medication Refill >> Jun 30, 2023  4:39 PM Everette C wrote: Most Recent Primary Care Visit:  Provider: Hassie Lint  Department: [facility]  Visit Type: OFFICE VISIT  Date: 04/08/2022  Medication: lisinopril (ZESTRIL) 20 MG tablet [[id number]]  Has the patient contacted their pharmacy? Yes (Agent: If no, request that the patient contact the pharmacy for the refill. If patient does not wish to contact the pharmacy document the reason why and proceed with request.) (Agent: If yes, when and what did the pharmacy advise?)  Is this the correct pharmacy for this prescription? Yes If no, delete pharmacy and type the correct one.  This is the patient's preferred pharmacy:  [facility] [address], [place] - [address] [place] [address] Phone: [phone] Fax: [phone]   Has the prescription been filled recently? Yes  Is the patient out of the medication? Yes  Has the patient been seen for an appointment in the last year OR does the patient have an upcoming appointment? Yes  Can we respond through MyChart? No  Agent: Please be advised that Rx refills may take up to 3 business days. We ask that you follow-up with your pharmacy.

## 2023-07-01 NOTE — Telephone Encounter (Signed)
 Unable to refill per protocol, courtesy refill already given, OV needed.  Requested Prescriptions  Pending Prescriptions Disp Refills   lisinopril (ZESTRIL) 20 MG tablet 30 tablet 0    Sig: TAKE 1 TABLET BY MOUTH ONCE DAILY .     Cardiovascular:  ACE Inhibitors Failed - 07/01/2023  4:13 PM      Failed - Cr in normal range and within 180 days    Creat  Date Value Ref Range Status  06/14/2013 1.19 (H) 0.50 - 1.10 mg/dL Final   Creatinine, Ser  Date Value Ref Range Status  06/23/2021 1.04 (H) 0.57 - 1.00 mg/dL Final         Failed - K in normal range and within 180 days    Potassium  Date Value Ref Range Status  06/23/2021 4.2 3.5 - 5.2 mmol/L Final         Failed - Last BP in normal range    BP Readings from Last 1 Encounters:  04/08/22 (!) 138/98         Failed - Valid encounter within last 6 months    Recent Outpatient Visits           1 year ago Class 2 severe obesity due to excess calories with serious comorbidity and body mass index (BMI) of 35.0 to 35.9 in adult [facility])   [facility] Of [place]. [facility] [place], [place] E[place], [place]   1 year ago Encounter for Papanicolaou smear for cervical cancer screening   [facility] [place]. [facility] Collins Dean, NP   2 years ago Essential hypertension   [facility] Dept Of [place]. [facility] Collins Dean, NP   2 years ago Acute right hip pain   [facility] - A Dept Of [place]. [facility] Mayers, Cari S, [place]   3 years ago Encounter to establish care   [facility] - [facility] Dept Of [place]. [facility] Collins Dean, [place]              Passed - Patient is not pregnant

## 2023-07-03 ENCOUNTER — Other Ambulatory Visit: Payer: Self-pay | Admitting: Family Medicine

## 2023-07-03 DIAGNOSIS — I10 Essential (primary) hypertension: Secondary | ICD-10-CM

## 2023-07-05 ENCOUNTER — Other Ambulatory Visit: Payer: Self-pay

## 2023-07-05 ENCOUNTER — Telehealth: Payer: Self-pay | Admitting: Nurse Practitioner

## 2023-07-05 DIAGNOSIS — I10 Essential (primary) hypertension: Secondary | ICD-10-CM

## 2023-07-05 MED ORDER — LISINOPRIL 20 MG PO TABS: ORAL_TABLET | ORAL | 0 refills | Status: AC

## 2023-07-05 NOTE — Telephone Encounter (Signed)
 Copied from CRM [id number]. Topic: Clinical - Medication Question  >> Jul 05, 2023 11:22 AM Brittany Morgan D wrote:  Reason for CRM: Patient stated that the pharmacy advised her to call the office regarding the medication for lisinopril  (ZESTRIL ) 20 MG tablet. Patient stated that she went to pick up the medication on Saturday but they stated due to the holiday they didn't have the medication and to contact the pcp.

## 2023-07-05 NOTE — Telephone Encounter (Signed)
 Medication has been sent. Appointment is needed for future refills.

## 2023-07-30 ENCOUNTER — Other Ambulatory Visit: Payer: Self-pay | Admitting: Nurse Practitioner

## 2023-07-30 DIAGNOSIS — I10 Essential (primary) hypertension: Secondary | ICD-10-CM
